# Patient Record
Sex: Female | Born: 1965 | Race: White | Hispanic: No | Marital: Married | Smoking: Never smoker
Health system: Southern US, Community
[De-identification: ages and names within clinical notes are randomized; demographics above are authoritative.]

## PROBLEM LIST (undated history)

## (undated) DIAGNOSIS — R7303 Prediabetes: Secondary | ICD-10-CM

## (undated) DIAGNOSIS — F32A Depression, unspecified: Secondary | ICD-10-CM

## (undated) DIAGNOSIS — E039 Hypothyroidism, unspecified: Secondary | ICD-10-CM

## (undated) DIAGNOSIS — R011 Cardiac murmur, unspecified: Secondary | ICD-10-CM

## (undated) DIAGNOSIS — E785 Hyperlipidemia, unspecified: Secondary | ICD-10-CM

## (undated) DIAGNOSIS — K219 Gastro-esophageal reflux disease without esophagitis: Secondary | ICD-10-CM

## (undated) DIAGNOSIS — R7309 Other abnormal glucose: Secondary | ICD-10-CM

## (undated) DIAGNOSIS — N979 Female infertility, unspecified: Secondary | ICD-10-CM

## (undated) DIAGNOSIS — N83201 Unspecified ovarian cyst, right side: Secondary | ICD-10-CM

## (undated) DIAGNOSIS — J189 Pneumonia, unspecified organism: Secondary | ICD-10-CM

## (undated) DIAGNOSIS — R5382 Chronic fatigue, unspecified: Secondary | ICD-10-CM

## (undated) DIAGNOSIS — Z9889 Other specified postprocedural states: Secondary | ICD-10-CM

## (undated) DIAGNOSIS — F329 Major depressive disorder, single episode, unspecified: Secondary | ICD-10-CM

## (undated) DIAGNOSIS — R112 Nausea with vomiting, unspecified: Secondary | ICD-10-CM

## (undated) DIAGNOSIS — D509 Iron deficiency anemia, unspecified: Secondary | ICD-10-CM

## (undated) DIAGNOSIS — D649 Anemia, unspecified: Secondary | ICD-10-CM

## (undated) DIAGNOSIS — F419 Anxiety disorder, unspecified: Secondary | ICD-10-CM

## (undated) HISTORY — DX: Hypothyroidism, unspecified: E03.9

## (undated) HISTORY — DX: Cardiac murmur, unspecified: R01.1

## (undated) HISTORY — DX: Hyperlipidemia, unspecified: E78.5

## (undated) HISTORY — DX: Iron deficiency anemia, unspecified: D50.9

## (undated) HISTORY — DX: Unspecified ovarian cyst, right side: N83.201

## (undated) HISTORY — DX: Other abnormal glucose: R73.09

## (undated) HISTORY — PX: WISDOM TOOTH EXTRACTION: SHX21

## (undated) HISTORY — DX: Female infertility, unspecified: N97.9

## (undated) HISTORY — DX: Anxiety disorder, unspecified: F41.9

## (undated) HISTORY — DX: Depression, unspecified: F32.A

## (undated) HISTORY — DX: Anemia, unspecified: D64.9

## (undated) HISTORY — DX: Major depressive disorder, single episode, unspecified: F32.9

## (undated) HISTORY — PX: PELVIC LAPAROSCOPY: SHX162

## (undated) HISTORY — DX: Chronic fatigue, unspecified: R53.82

## (undated) HISTORY — DX: Gastro-esophageal reflux disease without esophagitis: K21.9

## (undated) HISTORY — PX: MOUTH SURGERY: SHX715

---

## 2004-10-06 ENCOUNTER — Encounter (INDEPENDENT_AMBULATORY_CARE_PROVIDER_SITE_OTHER): Payer: Self-pay | Admitting: *Deleted

## 2004-10-06 LAB — CONVERTED CEMR LAB

## 2006-10-13 ENCOUNTER — Ambulatory Visit: Payer: Self-pay | Admitting: Internal Medicine

## 2006-10-18 ENCOUNTER — Ambulatory Visit: Payer: Self-pay | Admitting: Internal Medicine

## 2006-10-18 LAB — CONVERTED CEMR LAB
ALT: 17 units/L (ref 0–40)
AST: 20 units/L (ref 0–37)
Albumin: 3.7 g/dL (ref 3.5–5.2)
Alkaline Phosphatase: 49 units/L (ref 39–117)
Basophils Relative: 0.1 % (ref 0.0–1.0)
Chloride: 109 meq/L (ref 96–112)
Creatinine, Ser: 1 mg/dL (ref 0.4–1.2)
Eosinophil percent: 1.6 % (ref 0.0–5.0)
GFR calc non Af Amer: 65 mL/min
Glucose, Bld: 93 mg/dL (ref 70–99)
HCT: 41.4 % (ref 36.0–46.0)
Hemoglobin: 13.7 g/dL (ref 12.0–15.0)
Hgb A1c MFr Bld: 5.5 % (ref 4.6–6.0)
MCHC: 33.1 g/dL (ref 30.0–36.0)
Neutro Abs: 4 10*3/uL (ref 1.4–7.7)
Platelets: 276 10*3/uL (ref 150–400)
RBC: 4.78 M/uL (ref 3.87–5.11)

## 2007-01-04 ENCOUNTER — Other Ambulatory Visit: Admission: RE | Admit: 2007-01-04 | Discharge: 2007-01-04 | Payer: Self-pay | Admitting: Gynecology

## 2007-02-04 ENCOUNTER — Encounter: Admission: RE | Admit: 2007-02-04 | Discharge: 2007-02-04 | Payer: Self-pay | Admitting: Gynecology

## 2007-10-18 ENCOUNTER — Telehealth (INDEPENDENT_AMBULATORY_CARE_PROVIDER_SITE_OTHER): Payer: Self-pay | Admitting: *Deleted

## 2007-11-18 ENCOUNTER — Telehealth (INDEPENDENT_AMBULATORY_CARE_PROVIDER_SITE_OTHER): Payer: Self-pay | Admitting: *Deleted

## 2007-11-28 ENCOUNTER — Ambulatory Visit: Payer: Self-pay | Admitting: Internal Medicine

## 2007-11-28 DIAGNOSIS — K209 Esophagitis, unspecified without bleeding: Secondary | ICD-10-CM | POA: Insufficient documentation

## 2007-11-28 DIAGNOSIS — E039 Hypothyroidism, unspecified: Secondary | ICD-10-CM | POA: Insufficient documentation

## 2007-11-28 DIAGNOSIS — K219 Gastro-esophageal reflux disease without esophagitis: Secondary | ICD-10-CM

## 2007-11-28 DIAGNOSIS — E782 Mixed hyperlipidemia: Secondary | ICD-10-CM | POA: Insufficient documentation

## 2007-11-28 DIAGNOSIS — R7989 Other specified abnormal findings of blood chemistry: Secondary | ICD-10-CM | POA: Insufficient documentation

## 2007-12-01 ENCOUNTER — Encounter (INDEPENDENT_AMBULATORY_CARE_PROVIDER_SITE_OTHER): Payer: Self-pay | Admitting: *Deleted

## 2007-12-01 DIAGNOSIS — M5412 Radiculopathy, cervical region: Secondary | ICD-10-CM | POA: Insufficient documentation

## 2007-12-27 ENCOUNTER — Telehealth (INDEPENDENT_AMBULATORY_CARE_PROVIDER_SITE_OTHER): Payer: Self-pay | Admitting: *Deleted

## 2008-01-16 ENCOUNTER — Other Ambulatory Visit: Admission: RE | Admit: 2008-01-16 | Discharge: 2008-01-16 | Payer: Self-pay | Admitting: Gynecology

## 2008-02-14 ENCOUNTER — Ambulatory Visit: Payer: Self-pay | Admitting: Licensed Clinical Social Worker

## 2008-02-28 ENCOUNTER — Ambulatory Visit: Payer: Self-pay | Admitting: Licensed Clinical Social Worker

## 2008-10-04 ENCOUNTER — Telehealth (INDEPENDENT_AMBULATORY_CARE_PROVIDER_SITE_OTHER): Payer: Self-pay | Admitting: *Deleted

## 2008-10-06 ENCOUNTER — Encounter: Payer: Self-pay | Admitting: Internal Medicine

## 2008-11-07 ENCOUNTER — Ambulatory Visit: Payer: Self-pay | Admitting: Internal Medicine

## 2008-11-07 DIAGNOSIS — J309 Allergic rhinitis, unspecified: Secondary | ICD-10-CM | POA: Insufficient documentation

## 2008-11-07 DIAGNOSIS — D239 Other benign neoplasm of skin, unspecified: Secondary | ICD-10-CM | POA: Insufficient documentation

## 2009-07-24 ENCOUNTER — Ambulatory Visit: Payer: Self-pay | Admitting: Family Medicine

## 2009-07-28 ENCOUNTER — Telehealth: Payer: Self-pay | Admitting: Family Medicine

## 2009-09-05 ENCOUNTER — Ambulatory Visit: Payer: Self-pay | Admitting: Internal Medicine

## 2009-09-05 LAB — CONVERTED CEMR LAB
Albumin: 3.8 g/dL (ref 3.5–5.2)
Basophils Relative: 0.6 % (ref 0.0–3.0)
Eosinophils Absolute: 0.1 10*3/uL (ref 0.0–0.7)
Glucose, Urine, Semiquant: NEGATIVE
Hemoglobin: 13.2 g/dL (ref 12.0–15.0)
Ketones, urine, test strip: NEGATIVE
Lipase: 14 units/L (ref 11.0–59.0)
MCHC: 33.1 g/dL (ref 30.0–36.0)
MCV: 91.5 fL (ref 78.0–100.0)
Monocytes Absolute: 0.4 10*3/uL (ref 0.1–1.0)
Neutro Abs: 5 10*3/uL (ref 1.4–7.7)
Neutrophils Relative %: 70.7 % (ref 43.0–77.0)
Protein, U semiquant: NEGATIVE
RBC: 4.37 M/uL (ref 3.87–5.11)
RDW: 12.9 % (ref 11.5–14.6)
Specific Gravity, Urine: 1.01
Total Protein: 7.3 g/dL (ref 6.0–8.3)
WBC Urine, dipstick: NEGATIVE

## 2009-09-06 ENCOUNTER — Encounter: Admission: RE | Admit: 2009-09-06 | Discharge: 2009-09-06 | Payer: Self-pay | Admitting: Internal Medicine

## 2009-09-06 ENCOUNTER — Telehealth: Payer: Self-pay | Admitting: Internal Medicine

## 2009-09-09 ENCOUNTER — Encounter: Payer: Self-pay | Admitting: Internal Medicine

## 2009-09-09 ENCOUNTER — Ambulatory Visit (HOSPITAL_COMMUNITY): Admission: RE | Admit: 2009-09-09 | Discharge: 2009-09-09 | Payer: Self-pay | Admitting: General Surgery

## 2009-10-03 ENCOUNTER — Encounter: Payer: Self-pay | Admitting: Internal Medicine

## 2009-10-05 HISTORY — PX: CHOLECYSTECTOMY: SHX55

## 2010-03-19 ENCOUNTER — Telehealth (INDEPENDENT_AMBULATORY_CARE_PROVIDER_SITE_OTHER): Payer: Self-pay | Admitting: *Deleted

## 2010-04-09 ENCOUNTER — Telehealth (INDEPENDENT_AMBULATORY_CARE_PROVIDER_SITE_OTHER): Payer: Self-pay | Admitting: *Deleted

## 2010-04-10 ENCOUNTER — Telehealth (INDEPENDENT_AMBULATORY_CARE_PROVIDER_SITE_OTHER): Payer: Self-pay | Admitting: *Deleted

## 2010-10-07 ENCOUNTER — Ambulatory Visit
Admission: RE | Admit: 2010-10-07 | Discharge: 2010-10-07 | Payer: Self-pay | Source: Home / Self Care | Attending: Internal Medicine | Admitting: Internal Medicine

## 2010-10-07 DIAGNOSIS — T753XXA Motion sickness, initial encounter: Secondary | ICD-10-CM | POA: Insufficient documentation

## 2010-10-07 LAB — CONVERTED CEMR LAB: Cholesterol, target level: 200 mg/dL

## 2010-10-26 ENCOUNTER — Encounter: Payer: Self-pay | Admitting: Internal Medicine

## 2010-11-04 NOTE — Letter (Signed)
Summary: [facility]   Imported By: Lanelle Bal 11/18/2009 13:12:42  _____________________________________________________________________  External Attachment:    Type:   Image     Comment:   External Document

## 2010-11-04 NOTE — Progress Notes (Signed)
Summary: refill  Phone Note Refill Request Message from:  Patient  Refills Requested: Medication #1:  ACIPHEX 20 MG  TBEC take one tablet daily refill delayed at [facility] - patient wants 7 day supply called in - she said [facility] approved 7 days supply  [address]  Initial call taken by: Okey Regal Spring,  April 10, 2010 11:13 AM    Prescriptions: ACIPHEX 20 MG  TBEC (RABEPRAZOLE SODIUM) take one tablet daily  #7 x 0   Entered by:   Shonna Chock   Authorized by:   Marga Melnick MD   Signed by:   Shonna Chock on 04/10/2010   Method used:   Electronically to        [id number] (retail[facility]       [address]       [place], [place]  [address]       Ph: [phone] or [phone]       Fax: [phone]   RxID:   [id number]

## 2010-11-04 NOTE — Progress Notes (Signed)
Summary: NEW PRESCRIPTION TO [facility]  Phone Note Call from Patient Call back at Home Phone [phone]   Caller: Spouse Summary of Call: PATIENT'S HUSBAND ERIC CALLED TO REPORT THAT SHE [facility]ED [facility], BUT NOW HAS SET UP AN ACCOUNT AT [facility]  PLEASE FAX PRESCRIPTION FOR 90 DAYS PLUS REFILLS FOR ACIFEX 20 MG TO [facility]---TAKE ONE PER DAY   [facility]'S  NUMBER IS [phone]  PLEASE CALL PATIENT'S HUSBAND TO ADVISE THAT PRESCRIPTION HAS BEEN SENT INTO [facility] Initial call taken by: Jerolyn Shin,  March 19, 2010 2:16 PM  Follow-up for Phone Call        Patient aware RX sent in Follow-up by: Shonna Chock,  March 19, 2010 4:07 PM    Prescriptions: ACIPHEX 20 MG  TBEC (RABEPRAZOLE SODIUM) take one tablet daily  #90 x 1   Entered by:   Shonna Chock   Authorized by:   Marga Melnick MD   Signed by:   Shonna Chock on 03/19/2010   Method used:   Faxed to ...       [facility] (mail-order)             , [place]         Ph: [phone]       Fax: [phone]   RxID:   [id number]

## 2010-11-04 NOTE — Progress Notes (Signed)
Summary: RX Concerns  Phone Note Outgoing Call   Call placed by: Shonna Chock,  April 09, 2010 8:07 AM Call placed to: Patient Summary of Call: Left message on machine for patient to return call when avaliable, Reason for call:   Aciphex was filled on 03/19/2010 #90 with 1 refill([facility]), now we received refill from [facility] [address], ? which pharmacy patient is using./[facility]  April 09, 2010 8:09 AM   Follow-up for Phone Call        Spoke with patient, patient said [facility] is no longer [facility]. I will call them and have then d/c sending rx to [place] Follow-up by: Shonna Chock,  April 09, 2010 11:50 AM

## 2010-11-06 NOTE — Assessment & Plan Note (Signed)
Summary: something for motion sickness//ph   Vital Signs:  Patient profile:   46 year old female Height:      62.25 inches Weight:      169.4 pounds BMI:     30.85 Pulse rate:   72 / minute Resp:     12 per minute BP sitting:   122 / 88  (left arm) Cuff size:   large  Vitals Entered By: Shonna Chock CMA (October 07, 2010 11:58 AM) CC: 1.) Rx for motion sickness, 5 day trip    2.) Discuss sleep concerns, problems staying/falling asleep, Heartburn, Lipid Management   CC:  1.) Rx for motion sickness, 5 day trip    2.) Discuss sleep concerns, problems staying/falling asleep, Heartburn, and Lipid Management.  History of Present Illness:      This is a 45 year old woman who presents with ERD.  The patient reports   weight gain of  4#, but denies acid reflux, sour taste in mouth, epigastric pain, chest pain, and trouble swallowing.  The patient denies the following alarm features: melena, dysphagia, hematemesis, and vomiting.  Symptoms are worse with alcohol(red wine) , eating late  and citrus.  Prior evaluation has included EGD, but the exam was incomplete due to nsuboptimal sedation.  The patient has found the following treatments to be effective: a PPI.                                                                                                                                                      She requests meds for motion sickness ; she will be going on cruise. She gets symptoms with curvy roads, air travel with turbulence  &  carnival rides. No PMH of PVD.  Lipid Management History:      Negative NCEP/ATP III risk factors include female age less than 107 years old, no history of early menopause without estrogen hormone replacement, non-diabetic, no family history for ischemic heart disease, non-tobacco-user status, non-hypertensive, no ASHD (atherosclerotic heart disease), no prior stroke/TIA, no peripheral vascular disease, and no history of aortic aneurysm.     Current Medications  (verified): 1)  Aciphex 20 Mg  Tbec (Rabeprazole Sodium) .... Take One Tablet Daily**appointment Due** 2)  Synthroid 25 Mcg Tabs (Levothyroxine Sodium) .Marland Kitchen.. 1 By Mouth Once Daily 3)  Centrum   Tabs (Multiple Vitamins-Minerals) .... Take 1 Tablet By Mouth Once A Day 4)  Fexofenadine Hcl 180 Mg Tabs (Fexofenadine Hcl) .Marland Kitchen.. 1 Once Daily As Needed Allergies 5)  Tylenol Pm Extra Strength 500-25 Mg Tabs (Diphenhydramine-Apap (Sleep)) .Marland Kitchen.. 1 By Mouth At Bedtime As Needed  Allergies: 1)  ! Bactrim 2)  ! Augmentin  Past History:  Past Medical History: Insulin resistance GERD Hypothyroidism Hyperlipidemia: NMR Lipoprofile 2008.LDL 123(2539/1568) HDL 55, TG 179.  [place] Study LDL goal = < 160.  Review of Systems Neuro:  Denies sensation of room spinning and weakness.  Physical Exam  General:  well-nourished;alert,appropriate and cooperative throughout examination Eyes:  No corneal or conjunctival inflammation noted. EOMI. Perrla.No nystagmus Mouth:  Oral mucosa and oropharynx without lesions or exudates.  Teeth in good repair. No pharyngeal erythema.   Lungs:  Normal respiratory effort, chest expands symmetrically. Lungs are clear to auscultation, no crackles or wheezes. Heart:  normal rate, regular rhythm, no gallop, no rub, no JVD, no HJR, and grade 1 /6 systolic murmur.   Abdomen:  Bowel sounds positive,abdomen soft and non-tender without masses, organomegaly or hernias noted. Neurologic:  alert & oriented X3, gait normal, DTRs symmetrical and normal, finger-to-nose normal, and Romberg negative.   Skin:  Intact without suspicious lesions or rashes No jaundice Psych:  memory intact for recent and remote, normally interactive, and good eye contact.     Impression & Recommendations:  Problem # 1:  GERD (ICD-530.1) controlled Her updated medication list for this problem includes:    Aciphex 20 Mg Tbec (Rabeprazole sodium) .Marland Kitchen... Take one tablet daily**appointment due**  Problem # 2:   MOTION SICKNESS (ICD-994.6)  Problem # 3:  HYPERLIPIDEMIA (ICD-272.2)  Problem # 4:  HYPOTHYROIDISM (ICD-244.9) as per Dr Chevis Pretty Her updated medication list for this problem includes:    Synthroid 25 Mcg Tabs (Levothyroxine sodium) .Marland Kitchen... 1 by mouth once daily  Complete Medication List: 1)  Aciphex 20 Mg Tbec (Rabeprazole sodium) .... Take one tablet daily**appointment due** 2)  Synthroid 25 Mcg Tabs (Levothyroxine sodium) .Marland Kitchen.. 1 by mouth once daily 3)  Centrum Tabs (Multiple vitamins-minerals) .... Take 1 tablet by mouth once a day 4)  Fexofenadine Hcl 180 Mg Tabs (Fexofenadine hcl) .Marland Kitchen.. 1 once daily as needed allergies 5)  Tylenol Pm Extra Strength 500-25 Mg Tabs (Diphenhydramine-apap (sleep)) .Marland Kitchen.. 1 by mouth at bedtime as needed 6)  Transderm-scop 1.5 Mg Pt72 (Scopolamine base) .Marland Kitchen.. 1 patch every 3 days as needed  Lipid Assessment/Plan:      Based on NCEP/ATP III, the patient's risk factor category is "0-1 risk factors".  The patient's lipid goals are as follows: Total cholesterol goal is 200; LDL cholesterol goal is 160; HDL cholesterol goal is 40; Triglyceride goal is 150.    Patient Instructions: 1)  Please schedule a follow-up  fasting Lab appointment in 4 months for [facility] Panel (1304X), Code : 272.4, V17.3. 2)  It is important that you exercise regularly at least 20 minutes 5 times a week. If you develop chest pain, have severe difficulty breathing, or feel very tired , stop exercising immediately and seek medical attention. Prescriptions: TRANSDERM-SCOP 1.5 MG PT72 (SCOPOLAMINE BASE) 1 patch every 3 days as needed  #6 x 0   Entered and Authorized by:   Marga Melnick MD   Signed by:   Marga Melnick MD on 10/07/2010   Method used:   [facility] ...       [facility] [address] [phone][facility]retail)       [address]       [place], [place]  [address]       Ph: [phone] or [phone]       Fax: [phone]   RxID:   [id number]    Orders  Added: 1)  Est. Patient Level III [36644]

## 2011-01-01 ENCOUNTER — Telehealth: Payer: Self-pay | Admitting: Internal Medicine

## 2011-01-01 ENCOUNTER — Other Ambulatory Visit: Payer: Self-pay | Admitting: Internal Medicine

## 2011-01-01 MED ORDER — RABEPRAZOLE SODIUM 20 MG PO TBEC: 20.0000 mg | DELAYED_RELEASE_TABLET | Freq: Every day | ORAL | Status: DC

## 2011-01-01 NOTE — Telephone Encounter (Signed)
Lauren Mccullough called to report that their insurance is changing as of 4/1 from [facility] to [employer]----pt will be out of Aciphex and synthroid before 4/1, but doesn't want to order from [facility]----do we have any samples for 2 weeks to carry her over----she can call [employer] on 4/1 and place new order, but it will take 7-10 days as a new subscriber to get the first order to her---please call spouse to pick up samples

## 2011-01-06 LAB — COMPREHENSIVE METABOLIC PANEL
BUN: 7 mg/dL (ref 6–23)
Calcium: 9.5 mg/dL (ref 8.4–10.5)
Creatinine, Ser: 0.79 mg/dL (ref 0.4–1.2)
Glucose, Bld: 114 mg/dL — ABNORMAL HIGH (ref 70–99)
Total Protein: 7.2 g/dL (ref 6.0–8.3)

## 2011-01-06 LAB — URINALYSIS, ROUTINE W REFLEX MICROSCOPIC
Glucose, UA: NEGATIVE mg/dL
Hgb urine dipstick: NEGATIVE
Specific Gravity, Urine: 1.027 (ref 1.005–1.030)
Urobilinogen, UA: 1 mg/dL (ref 0.0–1.0)

## 2011-01-06 LAB — CBC
MCHC: 34.3 g/dL (ref 30.0–36.0)
MCV: 88.3 fL (ref 78.0–100.0)
Platelets: 296 10*3/uL (ref 150–400)
RDW: 13.1 % (ref 11.5–15.5)

## 2011-01-06 LAB — DIFFERENTIAL
Lymphs Abs: 1.1 10*3/uL (ref 0.7–4.0)
Monocytes Relative: 6 % (ref 3–12)
Neutro Abs: 5.8 10*3/uL (ref 1.7–7.7)
Neutrophils Relative %: 78 % — ABNORMAL HIGH (ref 43–77)

## 2011-01-06 LAB — HCG, SERUM, QUALITATIVE: Preg, Serum: NEGATIVE

## 2011-02-03 ENCOUNTER — Other Ambulatory Visit: Payer: Self-pay

## 2011-02-09 ENCOUNTER — Telehealth: Payer: Self-pay | Admitting: Internal Medicine

## 2011-02-09 NOTE — Telephone Encounter (Signed)
Called to alert [place] that a prior authorization will be coming for her Acifex---only has one pill left---[facility]

## 2011-02-10 NOTE — Telephone Encounter (Signed)
Prior auth in process information completed and faxed.

## 2011-02-11 NOTE — Telephone Encounter (Signed)
Needs to be on preferred medication since there's no documentation of having tried any other meds preferred drugs are omeprazole, pantoprazole, lansopazole, generic zegerid, and nexium.  Hop pls advise.

## 2011-02-11 NOTE — Telephone Encounter (Signed)
Go with nexium

## 2011-02-11 NOTE — Telephone Encounter (Signed)
Left msg for pt to return call.

## 2011-02-13 NOTE — Telephone Encounter (Signed)
Spoke w/ pt says she has tried recommended options and after looking at previous prior auth medication should have been covered from 07/03/11 contacted pharmacy and discuss information informed that rx has been taking care of and pt is able to pick up medication and pt also aware of this.

## 2011-04-10 ENCOUNTER — Telehealth: Payer: Self-pay | Admitting: Internal Medicine

## 2011-04-10 MED ORDER — RABEPRAZOLE SODIUM 20 MG PO TBEC: 20.0000 mg | DELAYED_RELEASE_TABLET | Freq: Every day | ORAL | Status: DC

## 2011-04-10 NOTE — Telephone Encounter (Signed)
Or do we have samples??

## 2011-04-10 NOTE — Telephone Encounter (Signed)
Prescription is for Acifex

## 2011-04-10 NOTE — Telephone Encounter (Signed)
Pt is aware that med has been sent in, also informed him we have no samples.

## 2011-07-06 ENCOUNTER — Other Ambulatory Visit: Payer: Self-pay

## 2011-07-06 MED ORDER — RABEPRAZOLE SODIUM 20 MG PO TBEC: 20.0000 mg | DELAYED_RELEASE_TABLET | Freq: Every day | ORAL | Status: DC

## 2011-07-06 NOTE — Telephone Encounter (Signed)
RX sent

## 2011-08-05 ENCOUNTER — Ambulatory Visit (INDEPENDENT_AMBULATORY_CARE_PROVIDER_SITE_OTHER)
Admission: RE | Admit: 2011-08-05 | Discharge: 2011-08-05 | Disposition: A | Discharge status: Home / Self Care | Payer: BC Managed Care – PPO | Source: Ambulatory Visit | Attending: Internal Medicine | Admitting: Internal Medicine

## 2011-08-05 ENCOUNTER — Ambulatory Visit (INDEPENDENT_AMBULATORY_CARE_PROVIDER_SITE_OTHER): Payer: BC Managed Care – PPO | Admitting: Internal Medicine

## 2011-08-05 DIAGNOSIS — R509 Fever, unspecified: Secondary | ICD-10-CM

## 2011-08-05 DIAGNOSIS — IMO0001 Reserved for inherently not codable concepts without codable children: Secondary | ICD-10-CM

## 2011-08-05 DIAGNOSIS — R0989 Other specified symptoms and signs involving the circulatory and respiratory systems: Secondary | ICD-10-CM

## 2011-08-05 DIAGNOSIS — R05 Cough: Secondary | ICD-10-CM

## 2011-08-05 DIAGNOSIS — M791 Myalgia, unspecified site: Secondary | ICD-10-CM

## 2011-08-05 LAB — CBC WITH DIFFERENTIAL/PLATELET
Basophils Absolute: 0 10*3/uL (ref 0.0–0.1)
Eosinophils Relative: 0.1 % (ref 0.0–5.0)
HCT: 33.6 % — ABNORMAL LOW (ref 36.0–46.0)
Lymphocytes Relative: 8.4 % — ABNORMAL LOW (ref 12.0–46.0)
Lymphs Abs: 0.5 10*3/uL — ABNORMAL LOW (ref 0.7–4.0)
Monocytes Relative: 6.5 % (ref 3.0–12.0)
Platelets: 241 10*3/uL (ref 150.0–400.0)
WBC: 5.6 10*3/uL (ref 4.5–10.5)

## 2011-08-05 LAB — POCT INFLUENZA A/B
Influenza A, POC: NEGATIVE
Influenza B, POC: NEGATIVE

## 2011-08-05 MED ORDER — AZITHROMYCIN 250 MG PO TABS: ORAL_TABLET | ORAL | Status: DC

## 2011-08-05 MED ORDER — HYDROCODONE-HOMATROPINE 5-1.5 MG/5ML PO SYRP: 5.0000 mL | ORAL_SOLUTION | Freq: Four times a day (QID) | ORAL | Status: AC | PRN

## 2011-08-05 NOTE — Progress Notes (Signed)
Addended by: Candie Echevaria L on: 08/05/2011 01:16 PM   Modules accepted: Orders

## 2011-08-05 NOTE — Progress Notes (Signed)
Addended by: Beverely Low on: 08/05/2011 02:09 PM   Modules accepted: Orders

## 2011-08-05 NOTE — Patient Instructions (Signed)
Plain Mucinex for thick secretions ;force NON dairy fluids for next 48 hrs. Use a Neti pot daily as needed for sinus congestion 

## 2011-08-05 NOTE — Progress Notes (Signed)
  Subjective:    Patient ID: Lauren Mccullough, female    DOB: 10-03-1966, 45 y.o.   MRN: [id number]  HPI As of 10/29 she has had diffuse myalgias ,chills, fever up to 101 and nonproductive cough. The major and minor symptoms of rhinosinusitis were reviewed. She denies  Sore throat , nasal congestion/obstruction; nasal purulence; facial pain; anosmia; fatigue;  headache; halitosis; earache or  dental pain.  Rx: Mucinex, NSAIDS with minor benefit  Review of Systems     Objective:   Physical Exam   She appears healthy and well-nourished and in no acute distress  She has no scleral icterus or conjunctivitis.  ENT exam was unremarkable  She has no lymphadenopathy about the neck or axilla  She exhibits an S4 gallop without significant murmur  Rhonchi and rales are suggested at the left lower lobe.  She has no cyanosis, clubbing, or edema.        Assessment & Plan:  #1 myalgias, chills, and fever; flu screen is negative  #2 nonproductive cough; abnormal breath sounds left lower lobe. Rule out walking pneumonia.  Plan: See orders and recommendations

## 2011-08-06 ENCOUNTER — Telehealth: Payer: Self-pay

## 2011-08-06 DIAGNOSIS — R509 Fever, unspecified: Secondary | ICD-10-CM

## 2011-08-06 DIAGNOSIS — R0989 Other specified symptoms and signs involving the circulatory and respiratory systems: Secondary | ICD-10-CM

## 2011-08-06 DIAGNOSIS — R05 Cough: Secondary | ICD-10-CM

## 2011-08-06 MED ORDER — AZITHROMYCIN 250 MG PO TABS: ORAL_TABLET | ORAL | Status: AC

## 2011-08-06 NOTE — Telephone Encounter (Signed)
Z-pak sent to wrong pharmacy.  Sent to correct pharmacy and pt's husband aware

## 2011-08-09 ENCOUNTER — Other Ambulatory Visit: Payer: Self-pay | Admitting: Internal Medicine

## 2011-08-09 DIAGNOSIS — J189 Pneumonia, unspecified organism: Secondary | ICD-10-CM

## 2011-08-11 ENCOUNTER — Ambulatory Visit (INDEPENDENT_AMBULATORY_CARE_PROVIDER_SITE_OTHER)
Admission: RE | Admit: 2011-08-11 | Discharge: 2011-08-11 | Disposition: A | Discharge status: Home / Self Care | Payer: BC Managed Care – PPO | Source: Ambulatory Visit | Attending: Internal Medicine | Admitting: Internal Medicine

## 2011-08-11 DIAGNOSIS — J189 Pneumonia, unspecified organism: Secondary | ICD-10-CM

## 2011-08-12 ENCOUNTER — Telehealth: Payer: Self-pay

## 2011-08-12 MED ORDER — FLUCONAZOLE 150 MG PO TABS: 150.0000 mg | ORAL_TABLET | Freq: Once | ORAL | Status: DC

## 2011-08-12 NOTE — Telephone Encounter (Signed)
Spoke with patient, patient aware of Chest Xray results. Patient already with a Zpak (one was sent to mail order company in error and was not cancelled, patient will take), patient stated taking abx's give her a yeast infection and requested rx for Diflucan. Copy of report mailed to patient   Per Dr.Hopper ok to send in rx for Diflucan

## 2011-08-12 NOTE — Telephone Encounter (Signed)
Message copied by Edgardo Roys on Wed Aug 12, 2011  8:47 AM ------      Message from: Pecola Lawless      Created: Tue Aug 11, 2011  5:54 PM       Almost complete resolution of the pneumonia picture. Please refill Z-Pak and take one pill a day until completed. Do not take 2 the first day. Continue to blow up the balloons. [employer]

## 2011-08-28 ENCOUNTER — Telehealth: Payer: Self-pay | Admitting: *Deleted

## 2011-08-28 NOTE — Telephone Encounter (Signed)
Recommend chest x-ray if she's having shortness of breath , fever or purulent secretions. If not allergic Hydromet 120 cc, 1 tsp every 6 hrs as needed, can be called in

## 2011-08-28 NOTE — Telephone Encounter (Signed)
Spoke with patient, patient denies any symptoms other than cough. Patient still with cough med from 2 weeks ago, will continue to take and call if any symptoms develop

## 2011-08-28 NOTE — Telephone Encounter (Signed)
Last OV 08-05-11 was treated for PNA and finished Z-pac 2 weeks ago, advised that she has a cough now, should this concern her enough to come in? Should she travel to [place] to visit with family that has young baby?

## 2011-12-25 ENCOUNTER — Other Ambulatory Visit: Payer: Self-pay | Admitting: Internal Medicine

## 2011-12-25 MED ORDER — RABEPRAZOLE SODIUM 20 MG PO TBEC: 20.0000 mg | DELAYED_RELEASE_TABLET | Freq: Every day | ORAL | Status: DC

## 2011-12-25 NOTE — Telephone Encounter (Signed)
RX sent

## 2011-12-25 NOTE — Telephone Encounter (Signed)
Refill for  aciphex tab 20mg  tablet ENT  Qty 90 Last written 10.1.12

## 2012-06-07 ENCOUNTER — Other Ambulatory Visit: Payer: Self-pay | Admitting: Internal Medicine

## 2012-06-07 MED ORDER — RABEPRAZOLE SODIUM 20 MG PO TBEC: 20.0000 mg | DELAYED_RELEASE_TABLET | Freq: Every day | ORAL | Status: DC

## 2012-06-07 NOTE — Telephone Encounter (Signed)
Refill ACIPHEX 20 MG Take 1 tablet (20 mg total) by mouth daily, #90  Last wrt 3.22.13 #90 wt/1-refill  last ov 10.31.12

## 2012-06-07 NOTE — Telephone Encounter (Signed)
RX sent in, patient will need to schedule CPX prior to next refill request (noted on RX)

## 2012-09-05 ENCOUNTER — Other Ambulatory Visit: Payer: Self-pay | Admitting: Internal Medicine

## 2012-09-05 MED ORDER — RABEPRAZOLE SODIUM 20 MG PO TBEC: DELAYED_RELEASE_TABLET | ORAL | Status: DC

## 2012-09-05 NOTE — Telephone Encounter (Signed)
Side Note: Patient will need to scheduled a CPX

## 2012-09-05 NOTE — Telephone Encounter (Signed)
refill ACIPHEX 20 MG Take 1 tablet (20 mg total) by mouth daily. #90 last OV 10.31.12, no future appts scheduled

## 2012-09-06 ENCOUNTER — Other Ambulatory Visit: Payer: Self-pay

## 2012-09-06 NOTE — Telephone Encounter (Signed)
Pt LMOVM stating plz fax back [facility] for Aciphex. Pt chart shows [facility] sent in Rx yesterday. I called pt and advised. Pt stated understanding and said she will call [facility] back to verify they received our fax.     MW

## 2012-11-25 ENCOUNTER — Ambulatory Visit (INDEPENDENT_AMBULATORY_CARE_PROVIDER_SITE_OTHER): Payer: BC Managed Care – PPO | Admitting: Internal Medicine

## 2012-11-25 ENCOUNTER — Encounter: Payer: Self-pay | Admitting: Internal Medicine

## 2012-11-25 VITALS — BP 122/78 | HR 78 | Temp 98.4°F | Wt 162.8 lb

## 2012-11-25 DIAGNOSIS — M6580 Other synovitis and tenosynovitis, unspecified site: Secondary | ICD-10-CM

## 2012-11-25 MED ORDER — RABEPRAZOLE SODIUM 20 MG PO TBEC: DELAYED_RELEASE_TABLET | ORAL | Status: DC

## 2012-11-25 NOTE — Progress Notes (Signed)
  Subjective:    Patient ID: Lauren Mccullough, female    DOB: 09-Jun-1966, 47 y.o.   MRN: [id number]  HPI The pain began 6 weeks ago in R elbow   without associated injury or trigger except yoga day prior to onset. It is described as  aching up to level 7. The pain does radiates down  to middle finger .  The discomfort lasts as long as flexing elbow or lifting even a coffee cup.  No associated signs/symptoms of redness, swelling, stiffness,skin color change,or temperature change. The pain was treated with NSAIDS  ; response was partial.                                                                                      Review of Systems  Constitutional: no fever, chills, sweats, change in weight  Musculoskeletal:no  muscle cramps or pain Neuro: no weakness; incontinence (stool/urine); numbness and tingling Heme:no lymphadenopathy; abnormal bruising or bleeding             Objective:   Physical Exam Gen.: Healthy and well-nourished in appearance. Alert, appropriate and cooperative throughout exam.  Neck: No deformities, masses, or tenderness noted. Range of motion &  Thyroid normal.                            Musculoskeletal/extremities: There is some asymmetry of the posterior thoracic musculature suggesting occult scoliosis.. No clubbing, cyanosis, edema, or significant extremity  deformity noted. Range of motion UE normal bilaterally .Tone & strength  normal.Joints normal. Nail health good. There is no pain with passive range of motion of the right elbow. With grip there is pain at the right lateral elbow. This is not appear to be worse with pronation. Vascular: Radial artery pulses are full and equal.  Neurologic: Alert and oriented x3. Deep tendon reflexes symmetrical and normal.  Skin: Intact without suspicious lesions or rashes. Lymph: No cervical, axillary lymphadenopathy present. Psych: Mood and affect are normal. Normally interactive         Assessment & Plan:  #1  tenosynovitis right olecranon area  Plan: See orders/ recommendations

## 2012-11-25 NOTE — Patient Instructions (Addendum)
Perform the exercises for elbow pain  twice a day as discussed. Use an anti-inflammatory cream such as Aspercreme or Zostrix cream twice a day to the R elbow AFTER EXERCISES. In lieu of this warm moist compresses or  hot water bottle can be used. Do not apply ice . Consider glucosamine sulfate 1500 mg daily for joint symptoms. Take this daily  for 3 -4 weeks. This will rehydrate the cartilages.

## 2013-02-21 ENCOUNTER — Ambulatory Visit (INDEPENDENT_AMBULATORY_CARE_PROVIDER_SITE_OTHER): Payer: BC Managed Care – PPO | Admitting: Family Medicine

## 2013-02-21 ENCOUNTER — Encounter: Payer: Self-pay | Admitting: Family Medicine

## 2013-02-21 VITALS — BP 130/90 | HR 87 | Temp 98.7°F | Wt 165.4 lb

## 2013-02-21 DIAGNOSIS — L255 Unspecified contact dermatitis due to plants, except food: Secondary | ICD-10-CM

## 2013-02-21 DIAGNOSIS — L247 Irritant contact dermatitis due to plants, except food: Secondary | ICD-10-CM

## 2013-02-21 MED ORDER — PREDNISONE 10 MG PO TABS: ORAL_TABLET | ORAL | Status: DC

## 2013-02-21 NOTE — Progress Notes (Signed)
  Subjective:     Lauren Mccullough is a 47 y.o. female who presents for evaluation of a rash involving the forearm, lower extremity and neck. Rash started several days ago. Lesions are pink, and blistering in texture. Rash has changed over time. Rash is pruritic. Associated symptoms: none. Patient denies: abdominal pain, arthralgia, congestion, cough, decrease in appetite, decrease in energy level, fever, headache, irritability, myalgia, nausea, sore throat and vomiting. Patient has not had contacts with similar rash. Patient has had new exposures (soaps, lotions, laundry detergents, foods, medications, plants, insects or animals).  The following portions of the patient's history were reviewed and updated as appropriate: allergies, current medications, past family history, past medical history, past social history, past surgical history and problem list.  Review of Systems Pertinent items are noted in HPI.    Objective:    BP 130/90  Pulse 87  Temp(Src) 98.7 F (37.1 C) (Oral)  Wt 165 lb 6.4 oz (75.025 kg)  BMI 29.31 kg/m2 General:  alert, cooperative, appears stated age and no distress  Skin:  erythema noted on extremities and trunk and vesicles noted on extremities and trunk     Assessment:    contact dermatitis: plants poison ivy ---probably from pets   Plan:    Medications: steroids: pred taper. verbal and written patient instruction given. Follow up in a few days. --if no better Use benadryl for itching

## 2013-02-21 NOTE — Patient Instructions (Addendum)
Poison Ivy  Poison ivy is a inflammation of the skin (contact dermatitis) caused by touching the allergens on the leaves of the ivy plant following previous exposure to the plant. The rash usually appears 48 hours after exposure. The rash is usually bumps (papules) or blisters (vesicles) in a linear pattern. Depending on your own sensitivity, the rash may simply cause redness and itching, or it may also progress to blisters which may break open. These must be well cared for to prevent secondary bacterial (germ) infection, followed by scarring. Keep any open areas dry, clean, dressed, and covered with an antibacterial ointment if needed. The eyes may also get puffy. The puffiness is worst in the morning and gets better as the day progresses. This dermatitis usually heals without scarring, within 2 to 3 weeks without treatment.  HOME CARE INSTRUCTIONS   Thoroughly wash with soap and water as soon as you have been exposed to poison ivy. You have about one half hour to remove the plant resin before it will cause the rash. This washing will destroy the oil or antigen on the skin that is causing, or will cause, the rash. Be sure to wash under your fingernails as any plant resin there will continue to spread the rash. Do not rub skin vigorously when washing affected area. Poison ivy cannot spread if no oil from the plant remains on your body. A rash that has progressed to weeping sores will not spread the rash unless you have not washed thoroughly. It is also important to wash any clothes you have been wearing as these may carry active allergens. The rash will return if you wear the unwashed clothing, even several days later.  Avoidance of the plant in the future is the best measure. Poison ivy plant can be recognized by the number of leaves. Generally, poison ivy has three leaves with flowering branches on a single stem.  Diphenhydramine may be purchased over the counter and used as needed for itching. Do not drive with  this medication if it makes you drowsy.Ask your caregiver about medication for children.  SEEK MEDICAL CARE IF:   Open sores develop.   Redness spreads beyond area of rash.   You notice purulent (pus-like) discharge.   You have increased pain.   Other signs of infection develop (such as fever).  Document Released: 09/18/2000 Document Revised: 12/14/2011 Document Reviewed: 08/07/2009  [employer] Patient Information 2013 [employer].

## 2013-03-30 IMAGING — CR DG CHEST 2V
2 series · 2 of 2 positions shown · non-contrast
Comparison: 08/05/2011

CLINICAL DATA: Cough

CHEST - 2 VIEW

[view not recorded (1 of 2)]
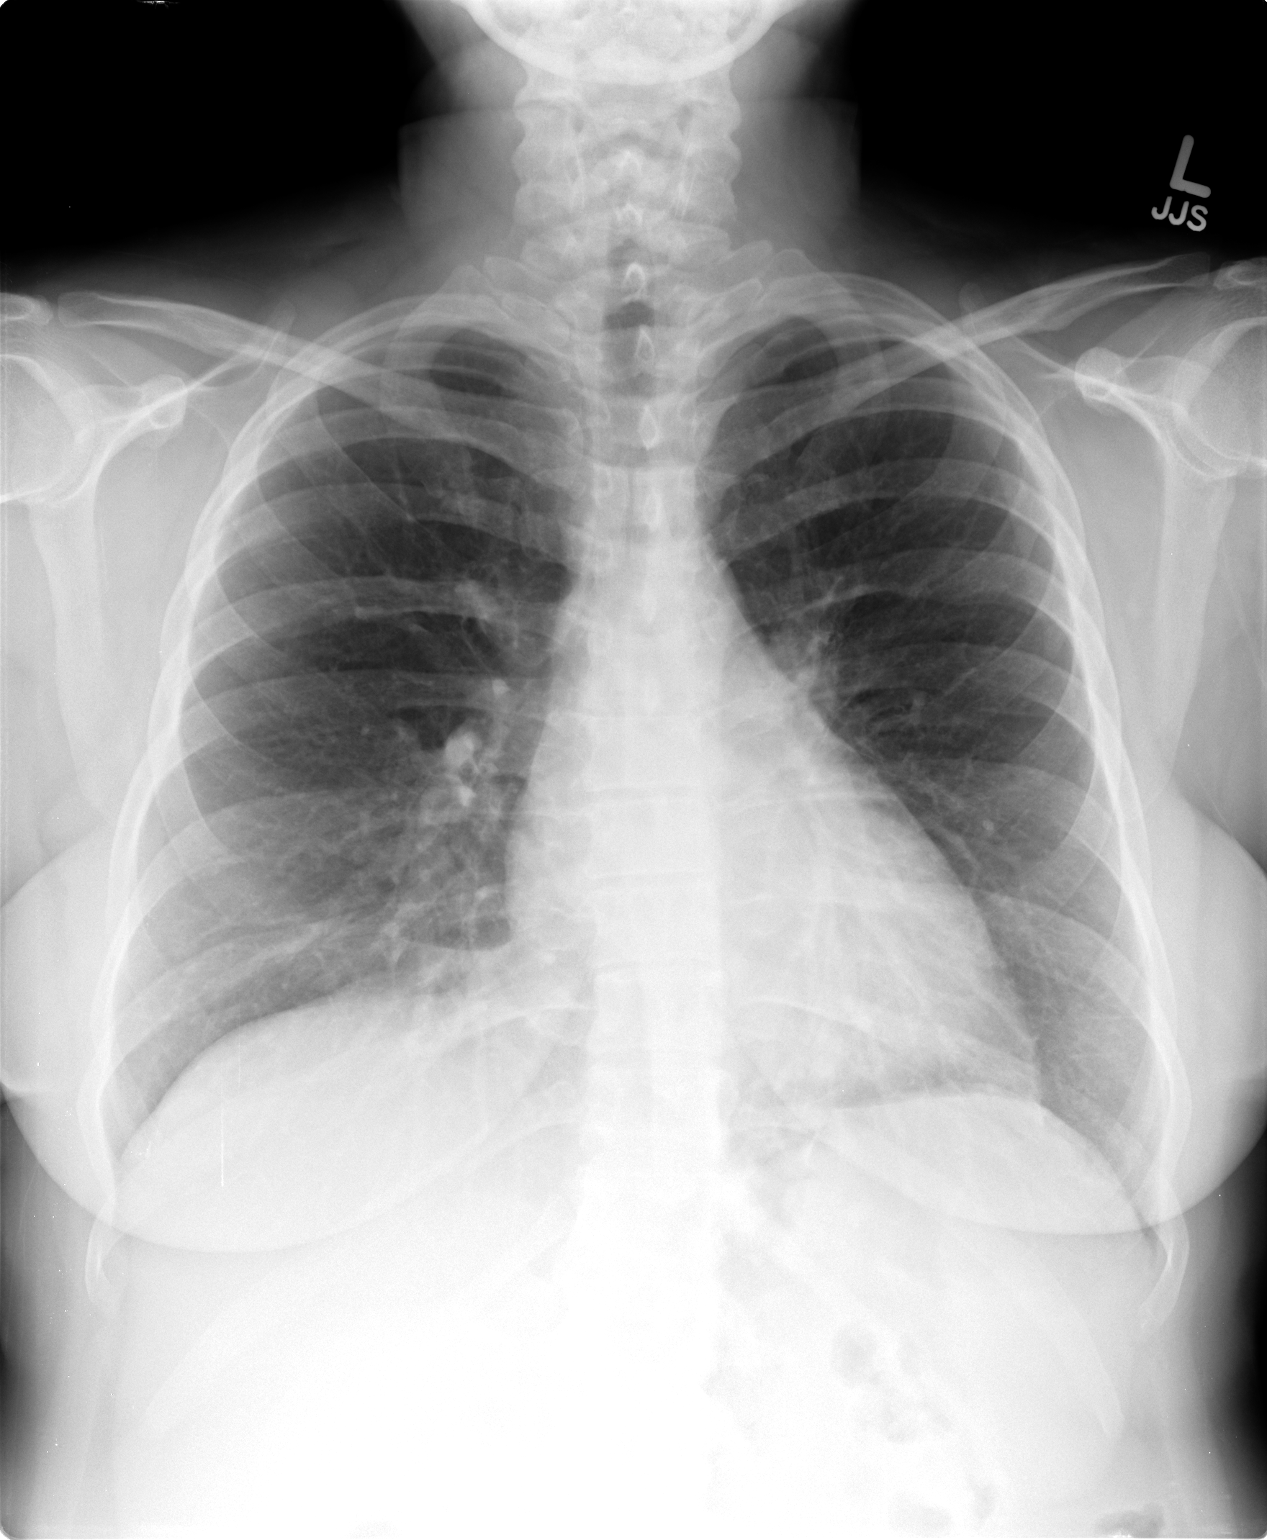

[view not recorded (2 of 2)]
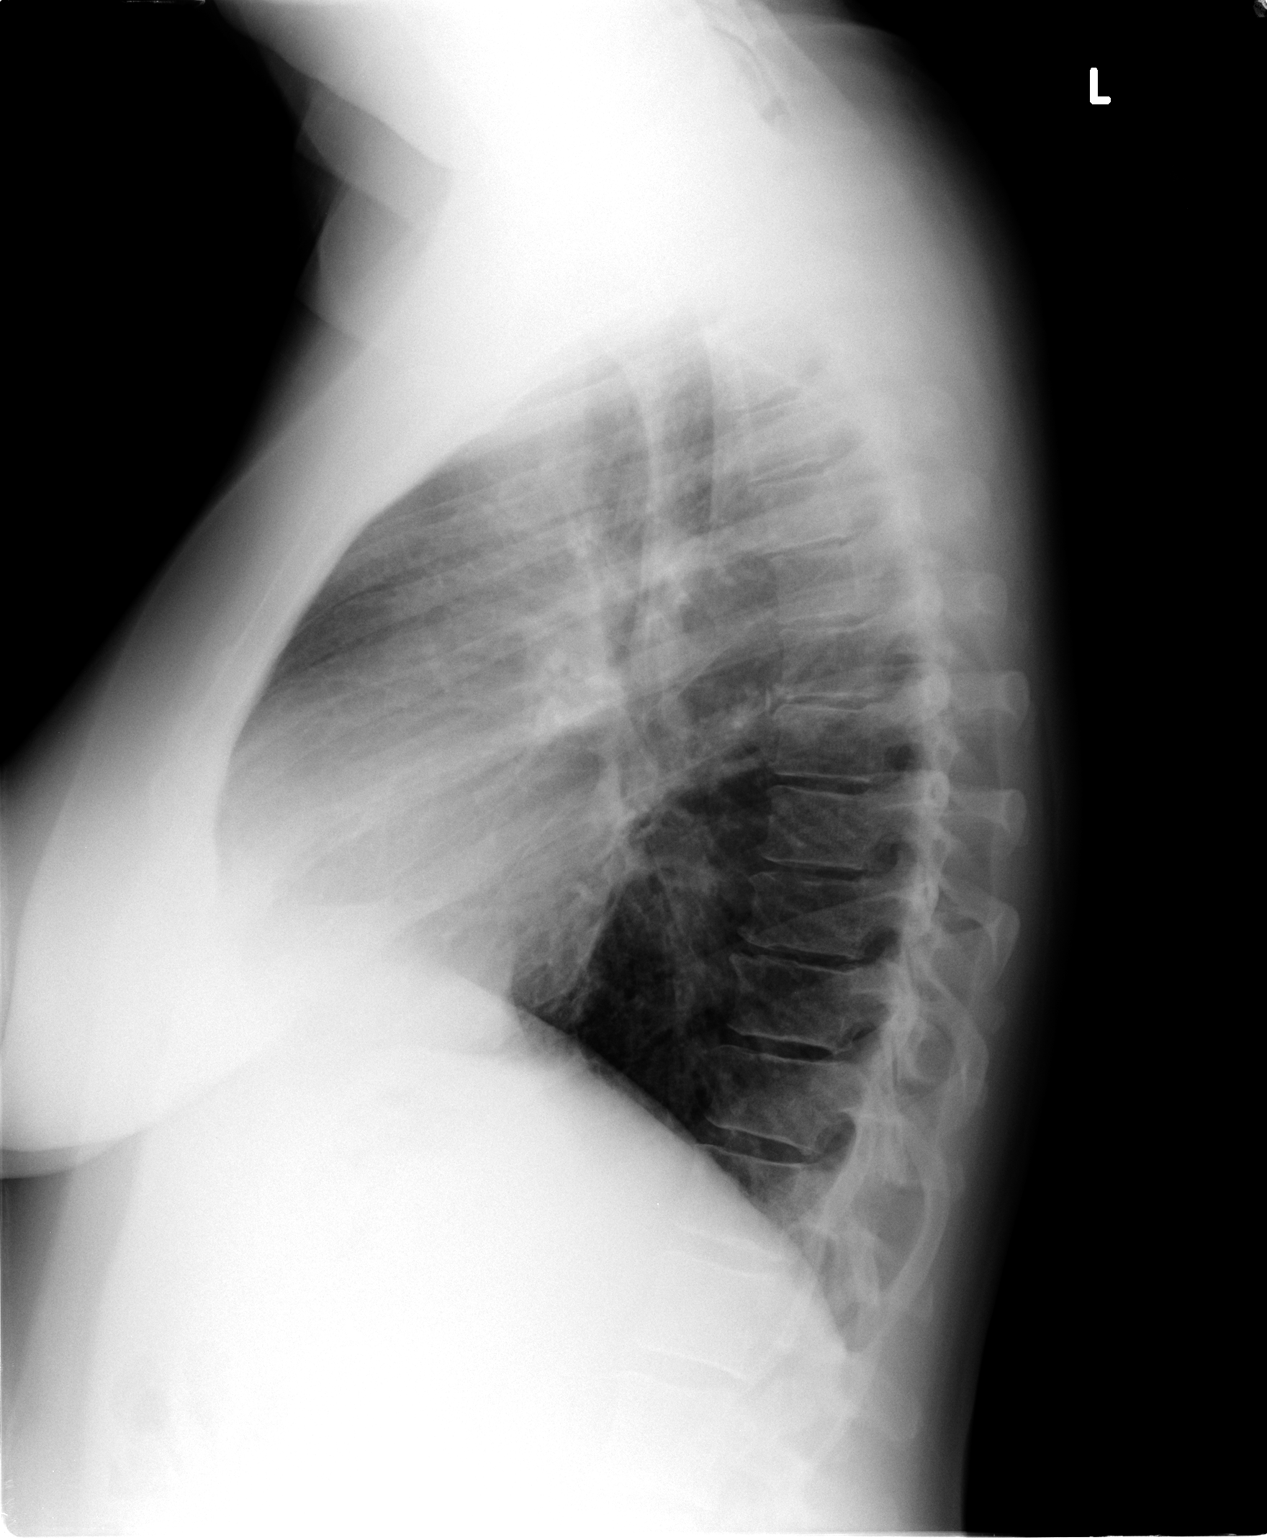

[2 of 2 positions shown; findings below may reference images not displayed]

FINDINGS: Vascular clips in the right upper abdomen.  Heart size
upper limits normal.  Interval improvement in the left lower lobe
airspace disease seen previously, with a minimal retrocardiac
residual.  Right lung clear.  No effusion.  Regional bones
unremarkable.
IMPRESSION: Near complete interval clearance of left lower lobe airspace
disease.

## 2013-05-18 ENCOUNTER — Other Ambulatory Visit: Payer: Self-pay | Admitting: *Deleted

## 2013-05-18 DIAGNOSIS — K219 Gastro-esophageal reflux disease without esophagitis: Secondary | ICD-10-CM

## 2013-05-18 MED ORDER — RABEPRAZOLE SODIUM 20 MG PO TBEC: DELAYED_RELEASE_TABLET | ORAL | Status: DC

## 2013-05-18 NOTE — Telephone Encounter (Signed)
Rx was filled for rabeprazole 20 mg.  Ag cma

## 2013-05-22 ENCOUNTER — Other Ambulatory Visit: Payer: Self-pay | Admitting: General Practice

## 2013-05-22 DIAGNOSIS — K219 Gastro-esophageal reflux disease without esophagitis: Secondary | ICD-10-CM

## 2013-05-22 MED ORDER — RABEPRAZOLE SODIUM 20 MG PO TBEC: DELAYED_RELEASE_TABLET | ORAL | Status: DC

## 2013-08-24 ENCOUNTER — Telehealth: Payer: Self-pay | Admitting: Internal Medicine

## 2013-08-24 NOTE — Telephone Encounter (Signed)
Patient is calling to request a refill on her rx RABEprazole (ACIPHEX) 20 MG tablet. Please advise.

## 2013-08-28 ENCOUNTER — Other Ambulatory Visit: Payer: Self-pay | Admitting: *Deleted

## 2013-08-28 DIAGNOSIS — K219 Gastro-esophageal reflux disease without esophagitis: Secondary | ICD-10-CM

## 2013-08-28 MED ORDER — RABEPRAZOLE SODIUM 20 MG PO TBEC: DELAYED_RELEASE_TABLET | ORAL | Status: DC

## 2013-08-28 NOTE — Telephone Encounter (Signed)
Refill for aciphex sent to [facility], pt made aware.

## 2013-08-29 NOTE — Telephone Encounter (Signed)
Rx for Aciphex 20mg  was filled on (08-28-13).//AB/CMA

## 2014-08-23 ENCOUNTER — Other Ambulatory Visit: Payer: Self-pay | Admitting: Gynecology

## 2014-08-28 LAB — CYTOLOGY - PAP

## 2014-09-04 ENCOUNTER — Ambulatory Visit (INDEPENDENT_AMBULATORY_CARE_PROVIDER_SITE_OTHER): Payer: 59 | Admitting: Internal Medicine

## 2014-09-04 ENCOUNTER — Encounter: Payer: Self-pay | Admitting: Internal Medicine

## 2014-09-04 ENCOUNTER — Other Ambulatory Visit: Payer: Self-pay | Admitting: Gynecology

## 2014-09-04 VITALS — BP 110/68 | HR 70 | Temp 98.5°F | Wt 169.1 lb

## 2014-09-04 DIAGNOSIS — L500 Allergic urticaria: Secondary | ICD-10-CM

## 2014-09-04 DIAGNOSIS — R928 Other abnormal and inconclusive findings on diagnostic imaging of breast: Secondary | ICD-10-CM

## 2014-09-04 MED ORDER — HYDROXYZINE HCL 10 MG PO TABS: 10.0000 mg | ORAL_TABLET | Freq: Four times a day (QID) | ORAL | Status: DC | PRN

## 2014-09-04 MED ORDER — RANITIDINE HCL 150 MG PO TABS: 150.0000 mg | ORAL_TABLET | Freq: Two times a day (BID) | ORAL | Status: DC

## 2014-09-04 MED ORDER — PREDNISONE 20 MG PO TABS
20.0000 mg | ORAL_TABLET | Freq: Every day | ORAL | Status: DC
Start: 2014-09-04 — End: 2015-09-12

## 2014-09-04 NOTE — Progress Notes (Signed)
Pre visit review using our clinic review tool, if applicable. No additional management support is needed unless otherwise documented below in the visit note. 

## 2014-09-04 NOTE — Patient Instructions (Addendum)
Avoid perfumes and cosmetics which are not hypoallergenic. Restrict hyperallergenic foods at this time: Nuts, strawberries, seafood , chocolate, and tomatoes. Take the ranitidine 30 minutes before breakfast and evening meal in place of Aciphex This blocks a histamine pathway as we discussed Fill the  prescription for Prednisone it there is not dramatic improvement in the next 48-72 hours.

## 2014-09-04 NOTE — Progress Notes (Signed)
   Subjective:    Patient ID: Lauren Mccullough, female    DOB: 1965/10/16, 48 y.o.   MRN: [id number]  HPI    Her rash over the chest began 1.5 weeks ago; the onset was 48 hours after an argument with her adult daughter  She's taken Benadryl for the itching as well as Cort Aid topically. The cream helped itching but the rash has progressed.  She has no constitutional symptoms.     Review of Systems  No associated itchy, watery eyes.  Swelling of the lips or tongue denied.  Shortness of breath, wheezing, or cough absent.   Fever ,chills , or sweats denied. Purulence absent.  Diarrhea not present.     Objective:   Physical Exam General appearance:good health ;well nourished; no acute distress or increased work of breathing is present.  No  lymphadenopathy about the head, neck, or axilla noted.   Eyes: No conjunctival inflammation or lid edema is present. There is no scleral icterus.  Ears:  External ear exam shows no significant lesions or deformities.  Otoscopic examination reveals clear canals, tympanic membranes are intact bilaterally without bulging, retraction, inflammation or discharge.  Nose:  External nasal examination shows no deformity or inflammation. Nasal mucosa are pink and moist without lesions or exudates. No septal dislocation or deviation.No obstruction to airflow.   Oral exam: Dental hygiene is good; lips and gums are healthy appearing.There is no oropharyngeal erythema or exudate noted.   Neck:  No deformities, thyromegaly, masses, or tenderness noted.   Supple with full range of motion without pain.   Heart:  Normal rate and regular rhythm. S1 and S2 normal without gallop, murmur, click, rub or other extra sounds.   Lungs:Chest clear to auscultation; no wheezes, rhonchi,rales ,or rubs present.No increased work of breathing.    Extremities:  No cyanosis, edema, or clubbing  noted    Skin: Warm & dry w/o jaundice or tenting. Classic urticarial rash over  the chest bilaterally. Classic dermatographia with skin stimulation.         Assessment & Plan:  #1 urticaria See orders

## 2014-09-20 ENCOUNTER — Ambulatory Visit
Admission: RE | Admit: 2014-09-20 | Discharge: 2014-09-20 | Disposition: A | Discharge status: Home / Self Care | Payer: 59 | Source: Ambulatory Visit | Attending: Gynecology | Admitting: Gynecology

## 2014-09-20 ENCOUNTER — Ambulatory Visit (INDEPENDENT_AMBULATORY_CARE_PROVIDER_SITE_OTHER): Payer: 59 | Admitting: Licensed Clinical Social Worker

## 2014-09-20 DIAGNOSIS — F331 Major depressive disorder, recurrent, moderate: Secondary | ICD-10-CM

## 2014-09-20 DIAGNOSIS — F411 Generalized anxiety disorder: Secondary | ICD-10-CM | POA: Diagnosis not present

## 2014-09-20 DIAGNOSIS — R928 Other abnormal and inconclusive findings on diagnostic imaging of breast: Secondary | ICD-10-CM

## 2014-10-04 ENCOUNTER — Ambulatory Visit (INDEPENDENT_AMBULATORY_CARE_PROVIDER_SITE_OTHER): Payer: 59 | Admitting: Licensed Clinical Social Worker

## 2014-10-04 DIAGNOSIS — F411 Generalized anxiety disorder: Secondary | ICD-10-CM | POA: Diagnosis not present

## 2014-10-04 DIAGNOSIS — F331 Major depressive disorder, recurrent, moderate: Secondary | ICD-10-CM

## 2014-10-10 ENCOUNTER — Ambulatory Visit (INDEPENDENT_AMBULATORY_CARE_PROVIDER_SITE_OTHER): Payer: 59 | Admitting: Licensed Clinical Social Worker

## 2014-10-10 DIAGNOSIS — F331 Major depressive disorder, recurrent, moderate: Secondary | ICD-10-CM

## 2014-10-10 DIAGNOSIS — F411 Generalized anxiety disorder: Secondary | ICD-10-CM

## 2014-10-17 ENCOUNTER — Ambulatory Visit (INDEPENDENT_AMBULATORY_CARE_PROVIDER_SITE_OTHER): Payer: 59 | Admitting: Licensed Clinical Social Worker

## 2014-10-17 DIAGNOSIS — F411 Generalized anxiety disorder: Secondary | ICD-10-CM

## 2014-10-17 DIAGNOSIS — F331 Major depressive disorder, recurrent, moderate: Secondary | ICD-10-CM

## 2014-10-24 ENCOUNTER — Ambulatory Visit (INDEPENDENT_AMBULATORY_CARE_PROVIDER_SITE_OTHER): Payer: 59 | Admitting: Licensed Clinical Social Worker

## 2014-10-24 DIAGNOSIS — F331 Major depressive disorder, recurrent, moderate: Secondary | ICD-10-CM

## 2014-10-24 DIAGNOSIS — F411 Generalized anxiety disorder: Secondary | ICD-10-CM

## 2014-10-31 ENCOUNTER — Ambulatory Visit (INDEPENDENT_AMBULATORY_CARE_PROVIDER_SITE_OTHER): Payer: 59 | Admitting: Licensed Clinical Social Worker

## 2014-10-31 DIAGNOSIS — F411 Generalized anxiety disorder: Secondary | ICD-10-CM

## 2014-10-31 DIAGNOSIS — F331 Major depressive disorder, recurrent, moderate: Secondary | ICD-10-CM

## 2014-11-05 ENCOUNTER — Ambulatory Visit: Payer: 59 | Admitting: Licensed Clinical Social Worker

## 2015-01-09 ENCOUNTER — Encounter: Payer: Self-pay | Admitting: Internal Medicine

## 2015-01-09 NOTE — Progress Notes (Signed)
Prior authorization for Rabeprazole Sodium has been faxed back to [facility] @ [phone]

## 2015-01-10 NOTE — Progress Notes (Signed)
Raberprazole Sodium has been approved until 01/09/16

## 2015-01-11 ENCOUNTER — Other Ambulatory Visit: Payer: Self-pay

## 2015-01-11 MED ORDER — RABEPRAZOLE SODIUM 20 MG PO TBEC: DELAYED_RELEASE_TABLET | ORAL | Status: DC

## 2015-09-12 ENCOUNTER — Ambulatory Visit (INDEPENDENT_AMBULATORY_CARE_PROVIDER_SITE_OTHER): Payer: 59 | Admitting: Obstetrics and Gynecology

## 2015-09-12 ENCOUNTER — Encounter: Payer: Self-pay | Admitting: Obstetrics and Gynecology

## 2015-09-12 VITALS — BP 142/82 | HR 80 | Resp 16 | Ht 62.25 in | Wt 160.8 lb

## 2015-09-12 DIAGNOSIS — Z01419 Encounter for gynecological examination (general) (routine) without abnormal findings: Secondary | ICD-10-CM | POA: Diagnosis not present

## 2015-09-12 DIAGNOSIS — N83201 Unspecified ovarian cyst, right side: Secondary | ICD-10-CM | POA: Diagnosis not present

## 2015-09-12 NOTE — Patient Instructions (Signed)

## 2015-09-12 NOTE — Progress Notes (Signed)
Patient ID: Lauren Mccullough, female   DOB: 1965-10-08, 49 y.o.   MRN: [id number] 49 y.o. G19P1011 Married Caucasian female here for annual exam.   Patient states it is also time for follow up ultrasound to check right ovary which Dr. Carren Rang has been following every 6 months (dermoid vs. Endometrioma).  Ultrasound on 06/20/15 - right ovary contains a 2.6 x 3.2 x 2.9 cm complex cyst with hyperechoic areas consistent with possible dermoid.  There is also a second 1.5 x 2.1 x 2.3 cm complex cystic area consistent with possible endometrioma versus dermoid. Left ovary not seen. Increase in size of the right ovarian complex.  Has had hx of borderline elevated CA125 = 33 on 10/11/14. Has been told in the past her uterus is also enlarged.  No pelvic pain or discomfort.  If anything, feels pain on her left side.  Menses monthly.  First three days are painful and heavy bleeding and really heavy for only a few hours. No intermenstrual bleeding.   Mood swings recently.  Uses cannibis to sleep.  Hx infertility.   Does not like the idea of surgery, but wants to be safe in her care.   Hx hypothyroidism.  Feels great when her thyroid is in balance.  Just had testing and Dr. Carren Rang refilled Synthroid.  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   PCP:  Unice Cobble, MD  Ref:     Delila Pereyra, MD  Patient's last menstrual period was 08/19/2015 (exact date).          Sexually active: Yes.   female The current method of family planning is none.    Exercising: No.   Smoker:  No (patient does   Health Maintenance: Pap:  08-23-14 Neg:no HR HPV done.   History of abnormal Pap:  no MMG:  09-20-14 abnormal Lt.breast and had recall;Had Lt.Diag.benign calcifications left breast/BiRads2/return to yearly screening:[facility]. Colonoscopy:  n/a BMD:   n/a  Result  n/a TDaP:  Years ago(maybe when she was a child) Screening Labs:   Will do with new PCP.    reports that she has never smoked. She does not  have any smokeless tobacco history on file. She reports that she drinks about 1.8 oz of alcohol per week. She reports that she uses illicit drugs (Marijuana) about 7 times per week.  Past Medical History  Diagnosis Date  . GERD (gastroesophageal reflux disease)   . Hyperlipemia     NMR 2008, LDL 123(2539/1568) HDL 55, TG 179  . Heart murmur   . Hypothyroidism     hypothyroidism  . Anxiety   . Depression   . Infertility, female     Past Surgical History  Procedure Laterality Date  . Cholecystectomy  2011  . Cesarean section  1993    Current Outpatient Prescriptions  Medication Sig Dispense Refill  . KRILL OIL PO Take by mouth. 3 by mouth daily    . lactobacillus acidophilus (BACID) TABS tablet Take 2 tablets by mouth daily.    Marland Kitchen levothyroxine (SYNTHROID, LEVOTHROID) 75 MCG tablet Take 75 mcg by mouth daily before breakfast.    . RABEprazole (ACIPHEX) 20 MG tablet 1 by mouth daily 90 tablet 3   No current facility-administered medications for this visit.    Family History  Problem Relation Age of Onset  . Diabetes Father   . Hypertension Father   . Diabetes Mother   . Hypertension Mother   . Thyroid disease Mother  Graves Disease  . Heart attack Maternal Grandfather   . Heart attack Paternal Grandfather   . Coronary artery disease Paternal Grandfather   . Breast cancer Paternal Grandmother   . Diabetes Maternal Grandmother   . Hypertension Maternal Grandmother     ROS:  Pertinent items are noted in HPI.  Otherwise, a comprehensive ROS was negative.  Exam:   BP 142/82 mmHg  Pulse 80  Resp 16  Ht 5' 2.25" (1.581 m)  Wt 160 lb 12.8 oz (72.938 kg)  BMI 29.18 kg/m2  LMP 08/19/2015 (Exact Date)    General appearance: alert, cooperative and appears stated age Head: Normocephalic, without obvious abnormality, atraumatic Neck: no adenopathy, supple, symmetrical, trachea midline and thyroid normal to inspection and palpation Lungs: clear to auscultation  bilaterally Breasts: normal appearance, no masses or tenderness, Inspection negative, No nipple retraction or dimpling, No nipple discharge or bleeding, No axillary or supraclavicular adenopathy, 1 cm raised brown nevus in left axillary region.  Heart: regular rate and rhythm Abdomen: soft, non-tender; bowel sounds normal; no masses,  no organomegaly Extremities: extremities normal, atraumatic, no cyanosis or edema Skin: Skin color, texture, turgor normal. No rashes or lesions Lymph nodes: Cervical, supraclavicular, and axillary nodes normal. No abnormal inguinal nodes palpated Neurologic: Grossly normal  Pelvic: External genitalia:  0.5 cm left labial subdermal cyst, nontender.              Urethra:  normal appearing urethra with no masses, tenderness or lesions              Bartholins and Skenes: normal                 Vagina: normal appearing vagina with normal color and discharge, no lesions              Cervix: no lesions              Pap taken: Yes.   Bimanual Exam:  Uterus:  enlarged, 7  weeks size              Adnexa: normal adnexa and no mass, fullness, tenderness              Rectovaginal: No..  Declines.   Chaperone was present for exam.  Assessment:   Well woman visit with normal exam. Right adnexal masses - possible endometriomas versus endometrioma and dermoid. Borderline elevated CA125.  Enlarged uterus.  ? Adenomyosis.  Hx infertility.  Hypothyroidism.   Plan: Yearly mammogram recommended after age 31.  Recommended self breast exam.  Pap and HR HPV as above. Information about Calcium, Vitamin D, regular exercise program including cardiovascular and weight bearing exercise. Labs performed.  Yes.  .   See orders.  CA125.  Refills given on medications.  No..  See orders. Return for pelvic ultrasound.  Follow up annually and prn.     After visit summary provided.

## 2015-09-13 LAB — CA 125: CA 125: 28 U/mL (ref <35)

## 2015-09-17 LAB — IPS PAP TEST WITH HPV

## 2015-09-19 ENCOUNTER — Telehealth: Payer: Self-pay | Admitting: Obstetrics and Gynecology

## 2015-09-19 NOTE — Telephone Encounter (Signed)
Called patient to review benefits for procedure. Left voicemail to call back and review. °

## 2015-12-23 ENCOUNTER — Telehealth: Payer: Self-pay | Admitting: Internal Medicine

## 2015-12-23 NOTE — Telephone Encounter (Signed)
Patient would like to establish with Dr. Birdie Riddle patient aware of relocation. Please advise

## 2015-12-26 NOTE — Telephone Encounter (Signed)
LM for pt to call and schedule 30 min OV, transfer from Dr. Linna Darner

## 2015-12-26 NOTE — Telephone Encounter (Signed)
Ok to establish 

## 2016-01-13 ENCOUNTER — Telehealth: Payer: Self-pay | Admitting: Obstetrics and Gynecology

## 2016-01-13 NOTE — Telephone Encounter (Signed)
Spoke with patient regarding benefits for ultrasound. Patient understood and agreeable. Patient ready to schedule. Scheduled for 01/16/16 with Dr Quincy Simmonds at 1030am. Patient aware and agreeable to arrival date/time. Patient aware and agreeable to 72 hour cancellation policy with [id number] fee. As appointment was made within this window, patient aware she must call by end of business day on 01/13/16 to cancel without penalty. No further questions. Ok to close.

## 2016-01-16 ENCOUNTER — Ambulatory Visit (INDEPENDENT_AMBULATORY_CARE_PROVIDER_SITE_OTHER): Payer: 59

## 2016-01-16 ENCOUNTER — Encounter: Payer: Self-pay | Admitting: Obstetrics and Gynecology

## 2016-01-16 ENCOUNTER — Ambulatory Visit (INDEPENDENT_AMBULATORY_CARE_PROVIDER_SITE_OTHER): Payer: 59 | Admitting: Obstetrics and Gynecology

## 2016-01-16 VITALS — BP 144/76 | HR 80 | Ht 62.25 in | Wt 154.4 lb

## 2016-01-16 DIAGNOSIS — E038 Other specified hypothyroidism: Secondary | ICD-10-CM | POA: Diagnosis not present

## 2016-01-16 DIAGNOSIS — N83201 Unspecified ovarian cyst, right side: Secondary | ICD-10-CM | POA: Diagnosis not present

## 2016-01-16 DIAGNOSIS — N940 Mittelschmerz: Secondary | ICD-10-CM

## 2016-01-16 LAB — THYROID PANEL WITH TSH
FREE THYROXINE INDEX: 3 (ref 1.4–3.8)
T3 Uptake: 25 % (ref 22–35)
T4, Total: 11.8 ug/dL (ref 4.5–12.0)
TSH: 1.21 m[IU]/L

## 2016-01-16 NOTE — Progress Notes (Signed)
Subjective  50 y.o. G11P1011 Married Caucasian female here for pelvic ultrasound for recheck of right ovarian cyst.   Patient states it is also time for follow up ultrasound to check right ovary which Dr. Carren Rang has been following every 6 months (dermoid vs. Endometrioma).  Ultrasound on 06/20/15 - right ovary contains a 2.6 x 3.2 x 2.9 cm complex cyst with hyperechoic areas consistent with possible dermoid. There is also a second 1.5 x 2.1 x 2.3 cm complex cystic area consistent with possible endometrioma versus dermoid. Left ovary not seen. Increase in size of the right ovarian complex.  Has had hx of borderline elevated CA125 = 33 on 10/11/14. Has been told in the past her uterus is also enlarged.  No pelvic pain or discomfort.  If anything, feels pain on her left side.  Menses monthly. First three days are painful and heavy bleeding and really heavy for only a few hours. No intermenstrual bleeding.   Took combined oral contraceptives in the past without problem. Not a smoker.  Today patient states she has been "sick." Menses late in December. In March around ovulation time, had pain across her abdomen and felt like she had a gas bubble extending from her lower abdomen to her sternum.  Almost went to ER due to pain.  Had vomiting next day but nut sure why.  This last weekend had ovulation type pain again one month later, and had gas feeling again.   Asking to have her thyroid checked.   Patient's last menstrual period was 12/26/2015.  Objective  Pelvic ultrasound images and report reviewed with patient.  Uterus - retroflexed.  No masses. EMS - 11.18 mm. Ovaries - enlarged right ovary with multiple cystic areas and debris most consistent with endometriomas.  Left ovary normal.  Both ovaries adherent to uterine sidewalls. Free fluid - yes, small amount.     Assessemnt  Right ovarian cysts and ovarian enlargement.  Possible endometriomas.  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  Abdominal bloating.  Ovulation versus gastrointestinal process.  Hypothyroidism.  Plan  Discussion of ovarian cysts and possible endometriomas with adhesive disease.  Reviewed signs and symptoms of torsion.  Discussed options for care including surgical intervention versus trial of oral contraceptives. Patient aware that endometriomas will not resolve with contraceptive pill treatment.  Check CA125 and TFTs. Final plan for ovarian cysts to follow after CA125 has returned.  Colonoscopy recommended.  Patient will consider.   ___25____ minutes face to face time of which over 50% was spent in counseling.   After visit summary to patient.

## 2016-01-17 LAB — CA 125: CA 125: 87 U/mL — ABNORMAL HIGH (ref <35)

## 2016-01-20 ENCOUNTER — Telehealth: Payer: Self-pay | Admitting: Obstetrics and Gynecology

## 2016-01-20 DIAGNOSIS — R971 Elevated cancer antigen 125 [CA 125]: Secondary | ICD-10-CM

## 2016-01-20 DIAGNOSIS — N83201 Unspecified ovarian cyst, right side: Secondary | ICD-10-CM

## 2016-01-20 NOTE — Telephone Encounter (Signed)
Phone call to cell phone - [phone] per DPI.  Phone call to discuss CA125 = 87.  This is higher than it has been in the past here and with Dr. Carren Rang. TFTs were normal.   Ultrasound showed right endometriomas and enlarged ovary.   I am recommending consultation with Dr. Everitt Amber regarding right ovarian cysts and elevated CA125.  I have discussed potential need for surgical removal of the ovary.   Patient accepts the referral.

## 2016-01-20 NOTE — Telephone Encounter (Signed)
Patient is unavailable on Tuesdays for consultation with Dr. Denman George.

## 2016-01-20 NOTE — Telephone Encounter (Signed)
Scheduled patient first available appointment with Dr.Rossi on 02/07/2016 at 11:30 am with 11 am arrival time. Spoke with patient. Advised of appointment date and time. She is agreeable. Provided information to Dr.Rossi's office as seen below. Patient is agreeable and verbalizes understanding.  [facility] [address] [place], [place] [address]  Main: [phone]   Routing to provider for final review. Patient agreeable to disposition. Will close encounter.

## 2016-02-07 ENCOUNTER — Encounter: Payer: Self-pay | Admitting: Gynecologic Oncology

## 2016-02-07 ENCOUNTER — Ambulatory Visit: Payer: 59 | Attending: Gynecologic Oncology | Admitting: Gynecologic Oncology

## 2016-02-07 VITALS — HR 90 | Temp 98.5°F | Resp 20 | Ht 62.25 in | Wt 152.3 lb

## 2016-02-07 DIAGNOSIS — F329 Major depressive disorder, single episode, unspecified: Secondary | ICD-10-CM | POA: Diagnosis not present

## 2016-02-07 DIAGNOSIS — N979 Female infertility, unspecified: Secondary | ICD-10-CM | POA: Insufficient documentation

## 2016-02-07 DIAGNOSIS — R971 Elevated cancer antigen 125 [CA 125]: Secondary | ICD-10-CM | POA: Insufficient documentation

## 2016-02-07 DIAGNOSIS — N83201 Unspecified ovarian cyst, right side: Secondary | ICD-10-CM | POA: Diagnosis not present

## 2016-02-07 DIAGNOSIS — E039 Hypothyroidism, unspecified: Secondary | ICD-10-CM | POA: Diagnosis not present

## 2016-02-07 DIAGNOSIS — K219 Gastro-esophageal reflux disease without esophagitis: Secondary | ICD-10-CM | POA: Diagnosis not present

## 2016-02-07 DIAGNOSIS — R011 Cardiac murmur, unspecified: Secondary | ICD-10-CM | POA: Diagnosis not present

## 2016-02-07 DIAGNOSIS — F419 Anxiety disorder, unspecified: Secondary | ICD-10-CM | POA: Diagnosis not present

## 2016-02-07 DIAGNOSIS — E785 Hyperlipidemia, unspecified: Secondary | ICD-10-CM | POA: Insufficient documentation

## 2016-02-07 DIAGNOSIS — Z880 Allergy status to penicillin: Secondary | ICD-10-CM | POA: Insufficient documentation

## 2016-02-07 NOTE — Progress Notes (Signed)
Consult Note: Gyn-Onc  Consult was requested by Dr. Quincy Simmonds for the evaluation of Lauren Mccullough 50 y.o. female  CC:  Chief Complaint  Patient presents with  . New Consultation    ovarian cyst , elevated CA 125 level    Assessment/Plan:  Ms. Lauren Mccullough  is a 50 y.o.  year old with a right ovarian complex cystic mass and elevated CA 125 (88).  I reviewed the [place] images with the patient.  I discussed that overall the likelihood of malignancy is low given that this mass has been present for >5years with minimal change. However, the only way to confirm this would be to perform surgery (laparoscopic RSO, possible hysterectomy). The patient is very reluctant to undergo surgery. Therefore, I believe it is reasonable to repeat her surveillance to 3 months with another ultrasound and CA 125 at that time. Morelia acknowledges that if the mass shows consistent growth and the CA 125 is consistently trending upward at that time, she will concede to surgery with pathologic confirmation.  Misako will contact Dr Elza Rafter office to schedule the [place] and CA 125 for consistency across radiology/lab.  HPI: Lauren Mccullough is a 50 year old P1 who is seen in consultation at the request of Dr Quincy Simmonds for a right complex ovarian cyst and elevated CA 125.  The patient has had surveillance of a known right ovarian complex mass (dermoid vs endometrioma) since 2012. She is very concerned about surgery and hospitals and prefers to avoid surgery if possible.  As part of follow-up for her ovarian cyst, she underwent US and CA 125 in April, 2017. This showed an increase in size in the right ovarian mass now measuring 7.3 x 4 x 4.3 cm with several cystic areas with debris and thickened wall. Both    ovaries appear adherent to the uterine sidewall suggestive of adhesive disease.  CA-125 was drawn on 01/20/2016 was elevated at 87 and had previously been 28.  The patient reports that since March she's been feeling increased  cyclical pain, bloating, and discomfort. 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   Current Meds:  Outpatient Encounter Prescriptions as of 02/07/2016  Medication Sig  . ASTAXANTHIN PO Take 1 tablet by mouth every morning.  Marland Kitchen KRILL OIL PO Take by mouth. 3 by mouth daily  . lactobacillus acidophilus (BACID) TABS tablet Take 2 tablets by mouth daily.  Marland Kitchen levothyroxine (SYNTHROID, LEVOTHROID) 75 MCG tablet Take 75 mcg by mouth daily before breakfast.  . MISC NATURAL PRODUCTS PO Take 1 tablet by mouth 2 (two) times daily. pueraria mirifica plus  . Nutritional Supplements (JOINT FORMULA PO) Take 1 tablet by mouth every morning.  . RABEprazole (ACIPHEX) 20 MG tablet 1 by mouth daily   No facility-administered encounter medications on file as of 02/07/2016.    Allergy:  Allergies  Allergen Reactions  . Amoxicillin-Pot Clavulanate     REACTION: GI discomfort  . Sulfamethoxazole-Trimethoprim     REACTION: pruritis w/o fever, rash or hives  . Augmentin [Amoxicillin-Pot Clavulanate] Diarrhea    Social Hx:   Social History   Social History  . Marital Status: Married    Spouse Name: N/A  . Number of Children: N/A  . Years of Education: N/A   Occupational History  . Not on file.   Social History Main Topics  . Smoking status: Never Smoker   . Smokeless tobacco: Not on file  . Alcohol Use: 1.8 oz/week    3 Standard drinks or equivalent per  week  . Drug Use: 7.00 per week    Special: Marijuana     Comment: Ingests nightly to help with sleep  . Sexual Activity:    Partners: Male    Patent examiner Protection: None   Other Topics Concern  . Not on file   Social History Narrative    Past Surgical Hx:  Past Surgical History  Procedure Laterality Date  . Cholecystectomy  2011  . Cesarean section  1993    Past Medical Hx:  Past Medical History  Diagnosis Date  . GERD (gastroesophageal reflux disease)   . Hyperlipemia     NMR 2008, LDL 123(2539/1568) HDL 55, TG 179  . Heart  murmur   . Hypothyroidism     hypothyroidism  . Anxiety   . Depression   . Infertility, female     Past Gynecological History:  C/s x 1 Patient has history of right ovarian cyst.  Patient's last menstrual period was 12/26/2015.  Family Hx:  Family History  Problem Relation Age of Onset  . Diabetes Father   . Hypertension Father   . Diabetes Mother   . Hypertension Mother   . Thyroid disease Mother     Graves Disease  . Heart attack Maternal Grandfather   . Heart attack Paternal Grandfather   . Coronary artery disease Paternal Grandfather   . Breast cancer Paternal Grandmother   . Diabetes Maternal Grandmother   . Hypertension Maternal Grandmother     Review of Systems:  Constitutional  Feels well,    ENT Normal appearing ears and nares bilaterally Skin/Breast  No rash, sores, jaundice, itching, dryness Cardiovascular  No chest pain, shortness of breath, or edema  Pulmonary  No cough or wheeze.  Gastro Intestinal  No nausea, vomitting, or diarrhoea. No bright red blood per rectum, + abdominal pain, no change in bowel movement, or constipation.  Genito Urinary  No frequency, urgency, dysuria, cyclic bleeding Musculo Skeletal  No myalgia, arthralgia, joint swelling or pain  Neurologic  No weakness, numbness, change in gait,  Psychology  No depression, anxiety, insomnia.   Vitals:  Pulse 90, temperature 98.5 F (36.9 C), temperature source Oral, resp. rate 20, height 5' 2.25" (1.581 m), weight 152 lb 4.8 oz (69.083 kg), last menstrual period 12/26/2015, SpO2 100 %.  Physical Exam: WD in NAD Neck  Supple NROM, without any enlargements.  Lymph Node Survey No cervical supraclavicular or inguinal adenopathy Cardiovascular  Pulse normal rate, regularity and rhythm. S1 and S2 normal.  Lungs  Clear to auscultation bilateraly, without wheezes/crackles/rhonchi. Good air movement.  Skin  No rash/lesions/breakdown  Psychiatry  Alert and oriented to person, place,  and time  Abdomen  Normoactive bowel sounds, abdomen soft, non-tender and nonobese without evidence of hernia.  Back No CVA tenderness Genito Urinary  Vulva/vagina: Normal external female genitalia.  No lesions. No discharge or bleeding.  Bladder/urethra:  No lesions or masses, well supported bladder  Vagina: normal  Cervix: Normal appearing, no lesions.  Uterus:  Small, retroflexed, mobile, no parametrial involvement or nodularity.  Adnexa: difficult to discern adnexal masses. Rectal  Good tone, no masses no cul de sac nodularity.  Extremities  No bilateral cyanosis, clubbing or edema.   Donaciano Eva, MD  02/07/2016, 11:57 AM

## 2016-02-07 NOTE — Patient Instructions (Signed)
Plan to follow up with Dr. Quincy Simmonds with an ultrasound in July.  Please call our office at [phone] for any questions or concerns.

## 2016-02-09 ENCOUNTER — Other Ambulatory Visit: Payer: Self-pay | Admitting: Obstetrics and Gynecology

## 2016-02-09 DIAGNOSIS — R978 Other abnormal tumor markers: Secondary | ICD-10-CM

## 2016-02-09 DIAGNOSIS — N83201 Unspecified ovarian cyst, right side: Secondary | ICD-10-CM

## 2016-02-10 ENCOUNTER — Telehealth: Payer: Self-pay | Admitting: Obstetrics and Gynecology

## 2016-02-10 NOTE — Telephone Encounter (Signed)
Spoke with pt regarding benefit for Ultrasound. Patient understood and agreeable. Patient ready to schedule. Patient scheduled 04/16/16 with Dr Quincy Simmonds. Pt aware of arrival date and time. Pt aware of 72 hours cancellation policy with $___ fee. No further questions. Ok to close

## 2016-02-11 ENCOUNTER — Telehealth: Payer: Self-pay

## 2016-02-11 NOTE — Telephone Encounter (Signed)
-----   Message from Nunzio Cobbs, MD sent at 02/09/2016 11:21 AM EDT ----- Regarding: Please call to schedule pelvic ultrasound and CA125 with me for July 2017 Please contact patient in follow up to her visit with Dr. Denman George on 02/07/16.  She has received recommendations to follow up with me for a repeat pelvic ultrasound and CA125 in July 2017.  This will need a precert, and I am sending an order through for this along with a future order for a CA125.   Thank you!  [place]

## 2016-02-11 NOTE — Telephone Encounter (Signed)
Lauren Mccullough at 02/10/2016 11:08 AM     Status: Signed       Expand All Collapse All   Spoke with pt regarding benefit for Ultrasound. Patient understood and agreeable. Patient ready to schedule. Patient scheduled 04/16/16 with Dr Quincy Simmonds. Pt aware of arrival date and time. Pt aware of 72 hours cancellation policy with $___ fee. No further questions. Ok to close       Routing to provider for final review. Patient agreeable to disposition. Will close encounter.

## 2016-02-17 ENCOUNTER — Other Ambulatory Visit: Payer: Self-pay | Admitting: Internal Medicine

## 2016-02-17 ENCOUNTER — Telehealth: Payer: Self-pay | Admitting: Obstetrics and Gynecology

## 2016-02-17 NOTE — Telephone Encounter (Signed)
Patient states she is returning a call to [place].

## 2016-03-17 ENCOUNTER — Telehealth: Payer: Self-pay | Admitting: Obstetrics and Gynecology

## 2016-03-17 NOTE — Telephone Encounter (Signed)
Patient called and said, "I am really sick with some issues I originally came to see Dr. Quincy Simmonds for. I'd like to come in and see her as soon as possible. I really don't even know how to describe the problem I am having."  Patient aware a nurse will call her back to further assess her and get her schedule.

## 2016-03-17 NOTE — Telephone Encounter (Signed)
Spoke with patient. Patient states that right before her cycle she has been experiencing abdominal pain and vomiting. Vomiting occurred for 1/2 day prior to her menses. Reports this occurred in April as well and she discussed this with Dr.Silva at her appointment on 01/16/2016. Patient started her menses this morning and reports she feels much better. Denies any current nausea or vomiting. "I am very concerned about these symptoms." Advised she will need to be seen in the office for further evaluation. Appointment scheduled for tomorrow 03/18/2016 at 8:15 am with Dr.Silva. She is agreeable to date and time.  Routing to provider for final review. Patient agreeable to disposition. Will close encounter.

## 2016-03-18 ENCOUNTER — Encounter: Payer: Self-pay | Admitting: Obstetrics and Gynecology

## 2016-03-18 ENCOUNTER — Ambulatory Visit (INDEPENDENT_AMBULATORY_CARE_PROVIDER_SITE_OTHER): Payer: 59 | Admitting: Obstetrics and Gynecology

## 2016-03-18 VITALS — BP 138/70 | HR 80 | Temp 98.1°F | Resp 16 | Ht 62.0 in | Wt 145.0 lb

## 2016-03-18 DIAGNOSIS — N83201 Unspecified ovarian cyst, right side: Secondary | ICD-10-CM | POA: Diagnosis not present

## 2016-03-18 MED ORDER — NORETHIN-ETH ESTRAD-FE BIPHAS 1 MG-10 MCG / 10 MCG PO TABS: 1.0000 | ORAL_TABLET | Freq: Every day | ORAL | Status: DC

## 2016-03-18 NOTE — Progress Notes (Signed)
GYNECOLOGY  VISIT   HPI: 50 y.o.   Married  Caucasian  female   G2P1011 with Patient's last menstrual period was 03/17/2016.   here for  Nausea and vomiting and abdominal pain before cycles. Occurred more than once. About the fourth time as per patient.   Pelvic ultrasound 01/16/16 Pelvic ultrasound images and report reviewed with patient.  Uterus - retroflexed. No masses. EMS - 11.18 mm. Ovaries - enlarged right ovary with multiple cystic areas and debris most consistent with endometriomas. Left ovary normal. Both ovaries adherent to uterine sidewalls. Free fluid - yes, small amount.  CA125 - 87 on 01/16/16.   Had GYN ONC consultation who recommended follow up here in July for a recheck of ovarian cyst and CA125.   Felt great for 8 weeks after GYN ONC visit.  Did loose 10 pounds.  Has had nausea and vomiting prior to starting menses.  Had a large bowel movement last night and menses started, and now she is feeling better. Emotional lability.  Wants relief of her symptoms but does not want surgery.  GYNECOLOGIC HISTORY: Patient's last menstrual period was 03/17/2016. Contraception:  Peri-menopause Menopausal hormone therapy:  none Last mammogram:  09-20-14 abnormal Lt.breast and had recall;Had Lt.Diag.benign calcifications left breast/BiRads2/return to yearly screening:[facility]. Last pap smear:   09/12/15 Pap and HR HPV negative Unable to void today        OB History    Gravida Para Term Preterm AB TAB SAB Ectopic Multiple Living   2 1 1  1 1    1          Patient Active Problem List   Diagnosis Date Noted  . Right ovarian cyst 02/07/2016  . Elevated CA-125 02/07/2016  . MOTION SICKNESS 10/07/2010  . NEVUS 11/07/2008  . ALLERGIC RHINITIS 11/07/2008  . CERVICAL RADICULOPATHY, LEFT 12/01/2007  . HYPOTHYROIDISM 11/28/2007  . HYPERLIPIDEMIA 11/28/2007  . GERD 11/28/2007  . OTHER ABNORMAL BLOOD CHEMISTRY 11/28/2007    Past Medical History  Diagnosis Date  .  GERD (gastroesophageal reflux disease)   . Hyperlipemia     NMR 2008, LDL 123(2539/1568) HDL 55, TG 179  . Heart murmur   . Hypothyroidism     hypothyroidism  . Anxiety   . Depression   . Infertility, female     Past Surgical History  Procedure Laterality Date  . Cholecystectomy  2011  . Cesarean section  1993    Current Outpatient Prescriptions  Medication Sig Dispense Refill  . ASTAXANTHIN PO Take 1 tablet by mouth every morning.    Marland Kitchen KRILL OIL PO Take by mouth. 3 by mouth daily    . lactobacillus acidophilus (BACID) TABS tablet Take 2 tablets by mouth daily.    Marland Kitchen levothyroxine (SYNTHROID, LEVOTHROID) 75 MCG tablet Take 75 mcg by mouth daily before breakfast.    . MISC NATURAL PRODUCTS PO Take 1 tablet by mouth 2 (two) times daily. pueraria mirifica plus    . Nutritional Supplements (JOINT FORMULA PO) Take 1 tablet by mouth every morning.    . RABEprazole (ACIPHEX) 20 MG tablet 1 by mouth daily 90 tablet 3   No current facility-administered medications for this visit.     ALLERGIES: Amoxicillin-pot clavulanate; Sulfamethoxazole-trimethoprim; and Augmentin  Family History  Problem Relation Age of Onset  . Diabetes Father   . Hypertension Father   . Diabetes Mother   . Hypertension Mother   . Thyroid disease Mother     Graves Disease  . Heart attack Maternal  Grandfather   . Heart attack Paternal Grandfather   . Coronary artery disease Paternal Grandfather   . Breast cancer Paternal Grandmother   . Diabetes Maternal Grandmother   . Hypertension Maternal Grandmother     Social History   Social History  . Marital Status: Married    Spouse Name: N/A  . Number of Children: N/A  . Years of Education: N/A   Occupational History  . Not on file.   Social History Main Topics  . Smoking status: Never Smoker   . Smokeless tobacco: Not on file  . Alcohol Use: 1.8 oz/week    3 Standard drinks or equivalent per week  . Drug Use: 7.00 per week    Special: Marijuana      Comment: Ingests nightly to help with sleep  . Sexual Activity:    Partners: Male    Patent examiner Protection: None   Other Topics Concern  . Not on file   Social History Narrative    ROS:  Pertinent items are noted in HPI.  PHYSICAL EXAMINATION:    BP 138/70 mmHg  Pulse 80  Temp(Src) 98.1 F (36.7 C)  Resp 16  Ht 5\' 2"  (1.575 m)  Wt 145 lb (65.772 kg)  BMI 26.51 kg/m2  LMP 03/17/2016    General appearance: alert, cooperative and appears stated age    Pelvic: External genitalia:  no lesions              Urethra:  normal appearing urethra with no masses, tenderness or lesions              Bartholins and Skenes: normal                 Vagina: normal appearing vagina with normal color and discharge, no lesions              Cervix: whitish area and nabothian cysts.  Menstrual flow.             Bimanual Exam:  Uterus:  normal size, contour, position, consistency, mobility, non-tender              Adnexa: fullness in right andexa.  Nontender.  No left adnexal mass or tenderness.             Chaperone was present for exam.  ASSESSMENT  Right ovarian cyst.  Probable endometriosis.  Mildly elevated CA125.  Following conservatively after had GYN ONC consultation.   PLAN  Start LoLoEstrin.  Risks and benefits reviewed.  Patient has no contraindications.  Follow up in one month for ultrasound and repeat CA125.  She understands the only way to rule out malignancy is to proceed with surgical removal of the ovaries and tubes and possible hysterectomy.   An After Visit Summary was printed and given to the patient.  __25____ minutes face to face time of which over 50% was spent in counseling.

## 2016-03-19 ENCOUNTER — Telehealth: Payer: Self-pay | Admitting: Obstetrics and Gynecology

## 2016-03-19 MED ORDER — NORETHIN ACE-ETH ESTRAD-FE 1-20 MG-MCG PO TABS: 1.0000 | ORAL_TABLET | Freq: Every day | ORAL | Status: DC

## 2016-03-19 NOTE — Telephone Encounter (Signed)
Left message to call Ijeoma Loor at [phone]. 

## 2016-03-19 NOTE — Telephone Encounter (Signed)
Patient wants to talk with the nurse. She would like to get a different type of birth control due to cost.

## 2016-03-19 NOTE — Telephone Encounter (Signed)
Spoke with patient. Patient states rx for Lo Loestrin Fe is going to be $110. Reports Dr.Silva advised her to contact the office if this medication would be too expensive. Asking when Dr.Silva feels she should have her Ca125 done after she starts to feel better. "I do not want to wait too long, but I want to feel better before I have it done." Advised I will speak with Dr.Silva and return call with further recommendations. She is agreeable.

## 2016-03-19 NOTE — Telephone Encounter (Signed)
Let's switch OCPs to LoEstrin 1/20.  I discussed this alternative if the LoLoEstrin were too expensive.  OK to 3 months to pharmacy of choice.   Patient is due to a pelvic ultrasound and CA125 in 1 month in the office with me. This was Dr. Serita Grit recommendation, which represents a 3 month recheck from the last utrasound/CA125 evaluation.

## 2016-03-19 NOTE — Telephone Encounter (Signed)
Spoke with patient. Advised of message as seen below from [place]. Patient is agreeable and verbalizes understanding. Rx for LoEstrin 1/20 #3 0RF sent to pharmacy on file.   Routing to provider for final review. Patient agreeable to disposition. Will close encounter.

## 2016-03-26 ENCOUNTER — Encounter: Payer: Self-pay | Admitting: Family Medicine

## 2016-03-26 ENCOUNTER — Ambulatory Visit (INDEPENDENT_AMBULATORY_CARE_PROVIDER_SITE_OTHER): Payer: 59 | Admitting: Family Medicine

## 2016-03-26 VITALS — BP 130/90 | HR 86 | Temp 99.1°F | Resp 16 | Ht 62.0 in | Wt 147.2 lb

## 2016-03-26 DIAGNOSIS — R109 Unspecified abdominal pain: Secondary | ICD-10-CM | POA: Insufficient documentation

## 2016-03-26 DIAGNOSIS — R1084 Generalized abdominal pain: Secondary | ICD-10-CM

## 2016-03-26 DIAGNOSIS — F329 Major depressive disorder, single episode, unspecified: Secondary | ICD-10-CM | POA: Insufficient documentation

## 2016-03-26 DIAGNOSIS — F418 Other specified anxiety disorders: Secondary | ICD-10-CM

## 2016-03-26 DIAGNOSIS — F32A Depression, unspecified: Secondary | ICD-10-CM

## 2016-03-26 DIAGNOSIS — E782 Mixed hyperlipidemia: Secondary | ICD-10-CM | POA: Diagnosis not present

## 2016-03-26 DIAGNOSIS — F419 Anxiety disorder, unspecified: Secondary | ICD-10-CM

## 2016-03-26 LAB — CBC WITH DIFFERENTIAL/PLATELET
BASOS ABS: 0 {cells}/uL (ref 0–200)
BASOS PCT: 0 %
EOS ABS: 64 {cells}/uL (ref 15–500)
Eosinophils Relative: 1 %
HEMATOCRIT: 32.7 % — AB (ref 35.0–45.0)
Hemoglobin: 10.3 g/dL — ABNORMAL LOW (ref 11.7–15.5)
LYMPHS PCT: 26 %
Lymphs Abs: 1664 cells/uL (ref 850–3900)
MCH: 22.8 pg — ABNORMAL LOW (ref 27.0–33.0)
MCHC: 31.5 g/dL — ABNORMAL LOW (ref 32.0–36.0)
MCV: 72.3 fL — AB (ref 80.0–100.0)
MONO ABS: 640 {cells}/uL (ref 200–950)
MPV: 10.5 fL (ref 7.5–12.5)
Monocytes Relative: 10 %
NEUTROS ABS: 4032 {cells}/uL (ref 1500–7800)
Neutrophils Relative %: 63 %
PLATELETS: 299 10*3/uL (ref 140–400)
RBC: 4.52 MIL/uL (ref 3.80–5.10)
RDW: 18.1 % — AB (ref 11.0–15.0)
WBC: 6.4 10*3/uL (ref 3.8–10.8)

## 2016-03-26 LAB — LIPID PANEL
Cholesterol: 197 mg/dL (ref 125–200)
HDL: 62 mg/dL (ref >=46)
LDL CALC: 99 mg/dL (ref <130)
TRIGLYCERIDES: 179 mg/dL — AB (ref <150)
Total CHOL/HDL Ratio: 3.2 Ratio (ref <=5.0)
VLDL: 36 mg/dL — AB (ref <30)

## 2016-03-26 LAB — HEPATIC FUNCTION PANEL
ALK PHOS: 34 U/L (ref 33–130)
ALT: 30 U/L — AB (ref 6–29)
AST: 28 U/L (ref 10–35)
Albumin: 4.2 g/dL (ref 3.6–5.1)
BILIRUBIN DIRECT: 0 mg/dL (ref <=0.2)
BILIRUBIN INDIRECT: 0.2 mg/dL (ref 0.2–1.2)
Total Bilirubin: 0.2 mg/dL (ref 0.2–1.2)
Total Protein: 7 g/dL (ref 6.1–8.1)

## 2016-03-26 LAB — BASIC METABOLIC PANEL
BUN: 11 mg/dL (ref 7–25)
CALCIUM: 8.9 mg/dL (ref 8.6–10.4)
CO2: 22 mmol/L (ref 20–31)
CREATININE: 0.91 mg/dL (ref 0.50–1.05)
Chloride: 103 mmol/L (ref 98–110)
Glucose, Bld: 100 mg/dL — ABNORMAL HIGH (ref 65–99)
Potassium: 3.7 mmol/L (ref 3.5–5.3)
Sodium: 135 mmol/L (ref 135–146)

## 2016-03-26 LAB — TSH: TSH: 1.99 m[IU]/L

## 2016-03-26 NOTE — Progress Notes (Signed)
   Subjective:    Patient ID: Lauren Mccullough, female    DOB: 02/09/1966, 50 y.o.   MRN: [id number]  HPI New to establish.  Previous MD- [facility]  GYN- Quincy Simmonds, UTD on pap.  Due for mammo at [facility] through Dr Quincy Simmonds.    'i have been sick this whole year'- pt began having 'strange episodes' in March 'where my stomach would blow up w/ gas'.  This would coincide w/ ovulating.  + endometriosis.  Plan was to take NSAIDs as needed for cramping.  + vomiting this month.  Has lost 20 lbs since year began.  When she has these episodes she gets very anxious and 'extremely depressed'.  Pt was started on OCPs by GYN 1 week ago to attempt to suppress ovulation.  Pt reports poor sleep, hx of chronic anxiety but worse recently.  Pt was previously on Zoloft which helped w/ sleep but caused 50 lb weight gain.  Pt reports she is 'phobic' of doctors and medications and medical procedures.  Refusing colonoscopy, cologuard b/c 'i don't think it would motivate me to do anything anyway'.  'i just want to feel good and live my life'.   Review of Systems For ROS see HPI     Objective:   Physical Exam  Constitutional: She is oriented to person, place, and time. She appears well-developed and well-nourished. No distress.  HENT:  Head: Normocephalic and atraumatic.  Right Ear: Tympanic membrane normal.  Left Ear: Tympanic membrane normal.  Nose: Mucosal edema and rhinorrhea present. Right sinus exhibits no maxillary sinus tenderness and no frontal sinus tenderness. Left sinus exhibits no maxillary sinus tenderness and no frontal sinus tenderness.  Mouth/Throat: Mucous membranes are normal. Posterior oropharyngeal erythema (w/ PND) present.  Eyes: Conjunctivae and EOM are normal. Pupils are equal, round, and reactive to light.  Neck: Normal range of motion. Neck supple.  Cardiovascular: Normal rate, regular rhythm and normal heart sounds.   Pulmonary/Chest: Effort normal and breath sounds normal. No respiratory distress. She has  no wheezes. She has no rales.  Abdominal: Soft. Bowel sounds are normal. She exhibits no distension. There is no tenderness. There is no rebound and no guarding.  Lymphadenopathy:    She has no cervical adenopathy.  Neurological: She is alert and oriented to person, place, and time.  Skin: Skin is warm and dry.  Psychiatric: She has a normal mood and affect. Her behavior is normal.  Vitals reviewed.         Assessment & Plan:

## 2016-03-26 NOTE — Patient Instructions (Signed)
Schedule your complete physical in 6 months We'll notify you of your lab results and make any changes if needed (we'll also fax your form) Continue to work on healthy diet and regular exercise Try and find ways to reduce anxiety w/ stress management- if this isn't working, let me know Call with any questions or concerns Hang in there!!!

## 2016-03-26 NOTE — Progress Notes (Signed)
Pre visit review using our clinic review tool, if applicable. No additional management support is needed unless otherwise documented below in the visit note. 

## 2016-03-27 ENCOUNTER — Other Ambulatory Visit: Payer: Self-pay | Admitting: Family Medicine

## 2016-03-27 ENCOUNTER — Encounter: Payer: Self-pay | Admitting: Family Medicine

## 2016-03-27 ENCOUNTER — Telehealth: Payer: Self-pay | Admitting: Family Medicine

## 2016-03-27 DIAGNOSIS — D509 Iron deficiency anemia, unspecified: Secondary | ICD-10-CM

## 2016-03-27 MED ORDER — RABEPRAZOLE SODIUM 20 MG PO TBEC: DELAYED_RELEASE_TABLET | ORAL | Status: AC

## 2016-03-27 NOTE — Telephone Encounter (Signed)
Pt states that she needs refills on aciphex, [facility]. Pt was not aware that she was running out when she came in yesterday

## 2016-03-27 NOTE — Telephone Encounter (Signed)
Medication filled to pharmacy as requested.   

## 2016-03-30 NOTE — Assessment & Plan Note (Signed)
Pt has hx of this.  Now controlling w/ healthy diet and regular exercise.  Check labs.  Start meds prn.  Will follow.

## 2016-03-30 NOTE — Assessment & Plan Note (Signed)
New.  Pt reports depression but is not interested in taking medication for this.  Denies SI/HI.  Hx of weight gain on Zoloft and b/c of this, prefers to handle her sxs w/o medication.  Encouraged counseling- pt states she will think about it.  Reviewed stress management- exercise, meditation, art, etc.  Will follow.

## 2016-03-30 NOTE — Assessment & Plan Note (Signed)
New.  Pt is in midst of GYN work up for abd pain and endometriosis.  Was started on OCPs 1 week in attempts to control sxs.  Pt thinks she is feeling better but 'can't be sure' since she just started medication.  Will follow along and assist as able.

## 2016-04-09 ENCOUNTER — Telehealth: Payer: Self-pay | Admitting: Obstetrics and Gynecology

## 2016-04-09 NOTE — Telephone Encounter (Signed)
Called patient to review benefits for a recommended procedure. Left Voicemail requesting a call back. °

## 2016-04-15 NOTE — Telephone Encounter (Signed)
Spoke with pt regarding benefit for ultrasound. Patient understood and agreeable. Patient ready to schedule. Patient scheduled 04/16/16 with Dr Quincy Simmonds. Pt aware of arrival date and time. Pt aware of 72 hours cancellation policy. No further questions. Ok to close

## 2016-04-16 ENCOUNTER — Encounter: Payer: Self-pay | Admitting: Obstetrics and Gynecology

## 2016-04-16 ENCOUNTER — Ambulatory Visit (INDEPENDENT_AMBULATORY_CARE_PROVIDER_SITE_OTHER): Payer: 59

## 2016-04-16 ENCOUNTER — Ambulatory Visit (INDEPENDENT_AMBULATORY_CARE_PROVIDER_SITE_OTHER): Payer: 59 | Admitting: Obstetrics and Gynecology

## 2016-04-16 VITALS — BP 130/82 | HR 76 | Ht 62.0 in | Wt 145.0 lb

## 2016-04-16 DIAGNOSIS — R978 Other abnormal tumor markers: Secondary | ICD-10-CM | POA: Diagnosis not present

## 2016-04-16 DIAGNOSIS — N83201 Unspecified ovarian cyst, right side: Secondary | ICD-10-CM

## 2016-04-16 MED ORDER — NORETHIN ACE-ETH ESTRAD-FE 1-20 MG-MCG PO TABS: 1.0000 | ORAL_TABLET | Freq: Every day | ORAL | Status: DC

## 2016-04-16 NOTE — Progress Notes (Signed)
GYNECOLOGY  VISIT   HPI: 50 y.o.   Married  Caucasian  female   G2P1011 with Patient's last menstrual period was 03/17/2016.   here for   Recheck of right ovary.  Chronic right ovarian cyst.  Feeling much better since starting birth control pills one month ago.  On LoEstrin 1/20 continuous contraception. Also doing accupuncture.  Better mood control.  No stomach upset.  Only crampy once.  Used NSAID. Libido is improved.  Really does not want surgery if she does not need it.  Has been followed very closely by her prior GYN for ovarian cysts for a long time.  I did ask for the patient to see GYN ONC on 02/07/16 due to the complex right ovarian cyst and newly elevated CA125 of 87 on 01/16/16. The risk of malignancy was thought to be low due to the chronic nature of the right ovarian cyst and patient history consistent with endometriosis.  Plan was for a 3 month follow up, which is the reason for today's visit.   Did lose 25 pounds over the last 6 months.  Was watching her diet but it came off more easily than she expected. Had a lot of vomiting due to pain from endometriosis.  Feels much better now and is gaining weight slowly back.  GYNECOLOGIC HISTORY: Patient's last menstrual period was 03/17/2016. Contraception:  Combined OCPs.         OB History    Gravida Para Term Preterm AB TAB SAB Ectopic Multiple Living   2 1 1  1 1    1          Patient Active Problem List   Diagnosis Date Noted  . Anxiety and depression 03/26/2016  . Abdominal pain 03/26/2016  . Right ovarian cyst 02/07/2016  . Elevated CA-125 02/07/2016  . MOTION SICKNESS 10/07/2010  . NEVUS 11/07/2008  . ALLERGIC RHINITIS 11/07/2008  . CERVICAL RADICULOPATHY, LEFT 12/01/2007  . HYPOTHYROIDISM 11/28/2007  . HYPERLIPIDEMIA 11/28/2007  . GERD 11/28/2007  . OTHER ABNORMAL BLOOD CHEMISTRY 11/28/2007    Past Medical History  Diagnosis Date  . GERD (gastroesophageal reflux disease)   . Hyperlipemia     NMR 2008,  LDL 123(2539/1568) HDL 55, TG 179  . Heart murmur   . Hypothyroidism     hypothyroidism  . Anxiety   . Depression   . Infertility, female     Past Surgical History  Procedure Laterality Date  . Cholecystectomy  2011  . Cesarean section  1993    Current Outpatient Prescriptions  Medication Sig Dispense Refill  . ASTAXANTHIN PO Take 1 tablet by mouth every morning.    Marland Kitchen KRILL OIL PO Take by mouth. 3 by mouth daily    . lactobacillus acidophilus (BACID) TABS tablet Take 2 tablets by mouth daily.    Marland Kitchen MISC NATURAL PRODUCTS PO Take 1 tablet by mouth 2 (two) times daily. pueraria mirifica plus    . norethindrone-ethinyl estradiol (JUNEL FE,GILDESS FE,LOESTRIN FE) 1-20 MG-MCG tablet Take 1 tablet by mouth daily.    . Nutritional Supplements (JOINT FORMULA PO) Take 1 tablet by mouth every morning.    . RABEprazole (ACIPHEX) 20 MG tablet 1 by mouth daily 90 tablet 3  . SYNTHROID 75 MCG tablet Take 75 mcg by mouth daily before breakfast.     No current facility-administered medications for this visit.     ALLERGIES: Augmentin; Amoxicillin-pot clavulanate; and Sulfamethoxazole-trimethoprim  Family History  Problem Relation Age of Onset  . Diabetes Father   .  Hypertension Father   . Diabetes Mother   . Hypertension Mother   . Thyroid disease Mother     Graves Disease  . Heart attack Maternal Grandfather   . Heart attack Paternal Grandfather   . Coronary artery disease Paternal Grandfather   . Breast cancer Paternal Grandmother   . Diabetes Maternal Grandmother   . Hypertension Maternal Grandmother     Social History   Social History  . Marital Status: Married    Spouse Name: N/A  . Number of Children: N/A  . Years of Education: N/A   Occupational History  . Not on file.   Social History Main Topics  . Smoking status: Never Smoker   . Smokeless tobacco: Not on file  . Alcohol Use: 1.8 oz/week    3 Standard drinks or equivalent per week  . Drug Use: 7.00 per week     Special: Marijuana     Comment: Ingests nightly to help with sleep  . Sexual Activity:    Partners: Male    Patent examiner Protection: None   Other Topics Concern  . Not on file   Social History Narrative    ROS:  Pertinent items are noted in HPI.  PHYSICAL EXAMINATION:    BP 130/82 mmHg  Pulse 76  Ht 5\' 2"  (1.575 m)  Wt 145 lb (65.772 kg)  BMI 26.51 kg/m2  LMP 03/17/2016    General appearance: alert, cooperative and appears stated age  Pelvic ultrasound -  Uterus no masses.  EMS 7.38 mm.  Right ovary - multiple cysts.   Largest cyst 65 x 59 x 40 mm, increased in size since prior ultrasound.  Avascular, smooth, echofree. 2nd cyst 33 x 27 x 31 mm. Avascular, smooth, internal echoes. 3rd cyst 19 x 11 mm.  Avascular, smooth. Left ovary normal and seen transabdominally only.  Bilateral ovaries adherent to the uterus. No free fluid.   ASSESSMENT  Chronic complex right ovarian cyst.   History is consistent with endometriosis. Possibility of cystadenoma cannot be excluded. Recent elevated CA125.  Patient improved on combined oral contraception.   PLAN  Review of ovarian cysts and patient's care plan.  Will check Ca125 today.  If elevated, back to Dr. Denman George for surgical consultation.  We reviewed torsion precautions.  At a minimum, patient will need a follow up ultrasound in Sept. 2017. Continue with continuous OCPs.    An After Visit Summary was printed and given to the patient.  __25____ minutes face to face time of which over 50% was spent in counseling.

## 2016-04-17 LAB — CA 125: CA 125: 29 U/mL (ref <35)

## 2016-04-28 ENCOUNTER — Encounter: Payer: Self-pay | Admitting: Obstetrics and Gynecology

## 2016-04-28 ENCOUNTER — Telehealth: Payer: Self-pay

## 2016-04-28 NOTE — Telephone Encounter (Signed)
Telephone encounter created to discuss mychart message with Dr.Silva. 

## 2016-04-28 NOTE — Telephone Encounter (Signed)
Visit Follow-Up Question  Message [id number]  From [facility] To Nunzio Cobbs, MD Sent 04/28/2016 4:43 PM  Hi Dr. Quincy Simmonds - Just tried calling at 4:30, but office is closed, though recording says office is open until 5?    Anyway, just wanted to touch base with you, as I believe I am spotting. I didnt think much of it, but wanted to let you know in case.   I had sex on Friday night and was bleeding (lightly) which had stopped by Sat morning, so i thought maybe my husband had accidentally scratched me or whatever, I was not uncomfortable.   Since then, it has seemed that the toilet paper was a little miscolored maybe once a day or every other day. Today it is moreso, though I do not think I would need to use a tampon or anything (so far) but it is more colored than previous times since Saturday on.    Im on the last of the 3 pack of pills, week 3, with just 2 pills left in the pack not counting the placebo row.   I didnt think it an emergency to call the # and I will try the office tomorrow morning, but did want to let you know in case you have instructions for me.    Thanks -  [employer]   Responsible Party   Pool - [facility] Pool No one has taken responsibility for this message.  No actions have been taken on this message.   Routing to Oliver fore review and advise.

## 2016-04-29 NOTE — Telephone Encounter (Signed)
Please offer the patient an appointment with me.  I am not sure if it is a cervical polyp, small laceration, or breakthrough bleeding on the pills. It is reassuring if she is not having pain.  Her recent pelvic ultrasound showed a normal endometrial lining.

## 2016-04-29 NOTE — Telephone Encounter (Signed)
Message left to return call to Shiah Berhow at [phone].    

## 2016-04-29 NOTE — Telephone Encounter (Signed)
Spoke with patient. She continues to have some spotting on toilet tissue intermittently. No pain or dysuria, no fevers or chills.  She will monitor spotting and call back if worsens.  Offered office visit today and patient declines due to her schedule.  Office visit scheduled for 05/01/16 at 0815 with Dr. Quincy Simmonds.  Routing to provider for final review. Patient agreeable to disposition. Will close encounter.

## 2016-05-01 ENCOUNTER — Ambulatory Visit (INDEPENDENT_AMBULATORY_CARE_PROVIDER_SITE_OTHER): Payer: 59 | Admitting: Obstetrics and Gynecology

## 2016-05-01 ENCOUNTER — Encounter: Payer: Self-pay | Admitting: Obstetrics and Gynecology

## 2016-05-01 VITALS — BP 140/82 | HR 76 | Ht 62.0 in | Wt 145.6 lb

## 2016-05-01 DIAGNOSIS — N926 Irregular menstruation, unspecified: Secondary | ICD-10-CM | POA: Diagnosis not present

## 2016-05-01 NOTE — Progress Notes (Signed)
GYNECOLOGY  VISIT   HPI: 50 y.o.   Married  Caucasian  female   Lauren Mccullough with No LMP recorded.  LMP was bout 03/20/16. here for vaginal bleeding which began after intercourse on 04-24-16 and patient states has been spotting since. Had an increase in acne just after this. Some back aching.  Wonders if this is really a period. Intercourse not painful.  On continuous combined OCPs.   Pelvic pain is improved. Feeling better emotionally.  Being followed for a complex right ovarian cyst.  Uterus normal with ultrasound on 04/16/16. Now has a normal CA125.  Has plan for follow up ultrasound in September.  GYNECOLOGIC HISTORY: No LMP recorded. Contraception:  OCP--Junel--takes continuously Menopausal hormone therapy:  none Last mammogram:  09/2014 Bil.mmg with Dr. Jinger Neighbors views of Lt.09-20-14 benign calcifications upper-outer quadrant Lt.br./[facility] Last pap smear:   09-12-15 Neg:Neg HR HPV        OB History    Gravida Para Term Preterm AB Living   2 1 1   1 1    SAB TAB Ectopic Multiple Live Births     1               Patient Active Problem List   Diagnosis Date Noted  . Anxiety and depression 03/26/2016  . Abdominal pain 03/26/2016  . Right ovarian cyst 02/07/2016  . Elevated CA-125 02/07/2016  . MOTION SICKNESS 10/07/2010  . NEVUS 11/07/2008  . ALLERGIC RHINITIS 11/07/2008  . CERVICAL RADICULOPATHY, LEFT 12/01/2007  . HYPOTHYROIDISM 11/28/2007  . HYPERLIPIDEMIA 11/28/2007  . GERD 11/28/2007  . OTHER ABNORMAL BLOOD CHEMISTRY 11/28/2007    Past Medical History:  Diagnosis Date  . Anxiety   . Depression   . GERD (gastroesophageal reflux disease)   . Heart murmur   . Hyperlipemia    NMR 2008, LDL 123(2539/1568) HDL 55, TG 179  . Hypothyroidism    hypothyroidism  . Infertility, female     Past Surgical History:  Procedure Laterality Date  . CESAREAN SECTION  1993  . CHOLECYSTECTOMY  2011    Current Outpatient Prescriptions  Medication Sig  Dispense Refill  . ASTAXANTHIN PO Take 1 tablet by mouth every morning.    Marland Kitchen KRILL OIL PO Take by mouth. 3 by mouth daily    . lactobacillus acidophilus (BACID) TABS tablet Take 2 tablets by mouth daily.    . norethindrone-ethinyl estradiol (JUNEL FE,GILDESS FE,LOESTRIN FE) 1-20 MG-MCG tablet Take 1 tablet by mouth daily. 3 Package 0  . Nutritional Supplements (JOINT FORMULA PO) Take 1 tablet by mouth every morning.    . RABEprazole (ACIPHEX) 20 MG tablet 1 by mouth daily 90 tablet 3  . SYNTHROID 75 MCG tablet Take 75 mcg by mouth daily before breakfast.     No current facility-administered medications for this visit.      ALLERGIES: Augmentin [amoxicillin-pot clavulanate]; Amoxicillin-pot clavulanate; and Sulfamethoxazole-trimethoprim  Family History  Problem Relation Age of Onset  . Diabetes Father   . Hypertension Father   . Diabetes Mother   . Hypertension Mother   . Thyroid disease Mother     Graves Disease  . Heart attack Maternal Grandfather   . Heart attack Paternal Grandfather   . Coronary artery disease Paternal Grandfather   . Breast cancer Paternal Grandmother   . Diabetes Maternal Grandmother   . Hypertension Maternal Grandmother     Social History   Social History  . Marital status: Married    Spouse name: N/A  . Number of children:  N/A  . Years of education: N/A   Occupational History  . Not on file.   Social History Main Topics  . Smoking status: Never Smoker  . Smokeless tobacco: Never Used  . Alcohol use 1.8 oz/week    3 Standard drinks or equivalent per week  . Drug use:     Frequency: 7.0 times per week    Types: Marijuana     Comment: Ingests nightly to help with sleep  . Sexual activity: Yes    Partners: Male    Birth control/ protection: None   Other Topics Concern  . Not on file   Social History Narrative  . No narrative on file    ROS:  Pertinent items are noted in HPI.  PHYSICAL EXAMINATION:    BP 140/82 (BP Location: Right  Arm, Patient Position: Sitting, Cuff Size: Normal)   Pulse 76   Ht 5\' 2"  (1.575 m)   Wt 145 lb 9.6 oz (66 kg)   BMI 26.63 kg/m     General appearance: alert, cooperative and appears stated age    Pelvic: External genitalia:  no lesions              Urethra:  normal appearing urethra with no masses, tenderness or lesions              Bartholins and Skenes: normal                 Vagina: normal appearing vagina with normal color and discharge, no lesions.  Blood in vagina.              Cervix: no lesions and nabothian cysts.               Bimanual Exam:  Uterus:  normal size, contour, position, consistency, mobility, non-tender              Adnexa: Right adnexal mass 5 - 6 cm.  Nontender.              Chaperone was present for exam.  ASSESSMENT  Break through bleeding on OCPs. Right ovarian cyst.  PLAN  Reassurance given.  Double up on OCPs for 5 days.  Then resume one pill by mouth daily.  Call for continued bleeding or recurrent breakthrough bleeding.  May need to go up to a 30 mcg pill if this continues to occur.  Pelvic ultrasound in September 2017.    An After Visit Summary was printed and given to the patient.  _15_____ minutes face to face time of which over 50% was spent in counseling.

## 2016-05-12 ENCOUNTER — Telehealth: Payer: Self-pay | Admitting: Obstetrics and Gynecology

## 2016-05-12 NOTE — Telephone Encounter (Signed)
Spoke with patient regarding the following. She states "this is not the answer I was hoping for. I would love to speak with Dr. Quincy Simmonds to have her opinion about it".

## 2016-05-12 NOTE — Telephone Encounter (Signed)
I would just have her finish out this pack and see if the abnormal bleeding persists in the next pack

## 2016-05-12 NOTE — Telephone Encounter (Signed)
Spoke with patient. Patient states she is currently taking Loestrin 1-20. Reports she was having BTB and was seen with Dr.Silva in July. Reports she doubled up on her pills for 5 days and then returned to taking 1 pill per day. Denies missing any pills or taking any pills late. On day 3 of taking 1 pill per day she began spotting again. Reports spotting has slowly increased and she is now having to wear a regular tampon. Advised patient per her last OV note we may need to look at placing her on a new OCP. Advised I will speak with covering provider as Dr.Silva is out of the office today regarding recommendations and return call. She is agreeable.  Cc: Dr.Silva

## 2016-05-12 NOTE — Telephone Encounter (Signed)
Patient says she has started spotting again.

## 2016-05-12 NOTE — Telephone Encounter (Signed)
Left message to call Lauren Mccullough at [phone]. 

## 2016-05-12 NOTE — Telephone Encounter (Signed)
Patient returning call.

## 2016-05-13 MED ORDER — NORETHIN ACE-ETH ESTRAD-FE 1.5-30 MG-MCG PO TABS: 1.0000 | ORAL_TABLET | Freq: Every day | ORAL | 0 refills | Status: DC

## 2016-05-13 NOTE — Telephone Encounter (Signed)
Spoke with patient. Advised of message as seen below from [place]. She is agreeable and verbalizes understanding. Rx for Loestrin 1.5/30 #1 0RF sent to [facility] on file. Patient will return call to the office after one month for refills if she is doing well with new dosage or before if she has any questions or further BTB. Appointment for PUS scheduled for 06/25/2016 at 8:30 am with 9 am consult with Dr.Silva. She is agreeable to date and time. Order was previously placed by Dr.Silva.  Routing to provider for final review. Patient agreeable to disposition. Will close encounter.

## 2016-05-13 NOTE — Telephone Encounter (Signed)
Have her continue two pills daily to finish the current pack of LoEstrin 1/20 as far as it will take her.  I then recommend switching to LoEstrin 1.5/30 directly without having a cycle, meaning skip the placebo pills at the end of the current pack of the LoEstrin 1/20. Patient needs to schedule her ultrasound for mid September to check her ovarian cyst.  Please make appointment.

## 2016-05-27 ENCOUNTER — Other Ambulatory Visit: Payer: Self-pay | Admitting: Obstetrics and Gynecology

## 2016-05-27 MED ORDER — NORETHIN ACE-ETH ESTRAD-FE 1.5-30 MG-MCG PO TABS: 1.0000 | ORAL_TABLET | Freq: Every day | ORAL | 0 refills | Status: DC

## 2016-05-27 NOTE — Telephone Encounter (Signed)
Medication refill request: Loestrin Last AEX:  09/12/15 BS Next AEX: 06/25/16 (PUS appointment) Last MMG (if hormonal medication request): n/a Refill authorized: 05/13/16 #1 Package 0R. Please advise. Thank you.  Patient states going out of town this weekend and will run out of medication before she returns.

## 2016-06-04 ENCOUNTER — Other Ambulatory Visit: Payer: Self-pay | Admitting: Obstetrics and Gynecology

## 2016-06-10 ENCOUNTER — Telehealth: Payer: Self-pay | Admitting: Obstetrics and Gynecology

## 2016-06-12 ENCOUNTER — Telehealth: Payer: Self-pay | Admitting: General Practice

## 2016-06-12 NOTE — Telephone Encounter (Signed)
Received a phone call from [place] that patient showed up to their department after having been told by someone at our office she could just show up and have chest x-ray completed.   I informed imaging that I was unaware of this and there were no notes in the system, care everywhere, or in media that explained why she was having this procedure completed.   Note forwarded to our front desk staff so that pt can come in for an office visit to receive some clarification and we can place order for [facility] to perform xray

## 2016-06-15 NOTE — Telephone Encounter (Signed)
I reached out to pt about appt, pt states that she went to [facility] and had a signed order to have chest Rx done. Pt states that she did not want to pay another co-pay to have the same thing done again. I advised pt that per KT, she could go to [facility] w/order and they should be able to take care of this for her

## 2016-06-19 ENCOUNTER — Other Ambulatory Visit: Payer: Self-pay | Admitting: Obstetrics and Gynecology

## 2016-06-19 MED ORDER — NORETHIN ACE-ETH ESTRAD-FE 1.5-30 MG-MCG PO TABS: 1.0000 | ORAL_TABLET | Freq: Every day | ORAL | 0 refills | Status: DC

## 2016-06-25 ENCOUNTER — Encounter: Payer: Self-pay | Admitting: Obstetrics and Gynecology

## 2016-06-25 ENCOUNTER — Other Ambulatory Visit: Payer: Self-pay | Admitting: Obstetrics and Gynecology

## 2016-06-25 ENCOUNTER — Ambulatory Visit (INDEPENDENT_AMBULATORY_CARE_PROVIDER_SITE_OTHER): Payer: 59

## 2016-06-25 ENCOUNTER — Ambulatory Visit (INDEPENDENT_AMBULATORY_CARE_PROVIDER_SITE_OTHER): Payer: 59 | Admitting: Obstetrics and Gynecology

## 2016-06-25 VITALS — BP 118/78 | HR 70 | Resp 16 | Ht 62.0 in | Wt 144.0 lb

## 2016-06-25 DIAGNOSIS — R7309 Other abnormal glucose: Secondary | ICD-10-CM | POA: Diagnosis not present

## 2016-06-25 DIAGNOSIS — N83201 Unspecified ovarian cyst, right side: Secondary | ICD-10-CM | POA: Diagnosis not present

## 2016-06-25 DIAGNOSIS — D509 Iron deficiency anemia, unspecified: Secondary | ICD-10-CM | POA: Diagnosis not present

## 2016-06-25 MED ORDER — NORETHIN ACE-ETH ESTRAD-FE 1.5-30 MG-MCG PO TABS: 1.0000 | ORAL_TABLET | Freq: Every day | ORAL | 0 refills | Status: DC

## 2016-06-25 NOTE — Progress Notes (Signed)
GYNECOLOGY  VISIT   HPI: 50 y.o.   Married  Caucasian  female   G2P1011 with Patient's last menstrual period was 03/20/2016.   here for   Pelvic ultrasound for a recheck of right ovarian cysts suspicious for endometriomas. Patient has wanted to avoid surgical intervention.  She did have a temporary rise in CA125 to a level of 87 on 01/16/16 and had consultation with GYN ONC who recommended close follow up.  She subsequently had a decline in the CA125 down to 29 on 04/16/16.  Pelvic ultrasound scan on 04/16/16: Uterus no masses.  EMS 7.38 mm.  Right ovary - multiple cysts.   Largest cyst 65 x 59 x 40 mm, increased in size since prior ultrasound.  Avascular, smooth, echofree. 2nd cyst 33 x 27 x 31 mm. Avascular, smooth, internal echoes. 3rd cyst 19 x 11 mm.  Avascular, smooth. Left ovary normal and seen transabdominally only.  Bilateral ovaries adherent to the uterus. No free fluid.   Patient is now on combined oral contraceptives since June 2017 for symptom control and feeling better overall.   Notes acne while on continuous OCPs LoEstrin 1.5/30. Spots occasionally. Some waves of nausea and vomiting accompanied by a hot flash.   Has some urge for a bowel movement at this time as well. Does not think it is caused by the estrogen in the pills.   No prior colonoscopy.  Declines this.  Has established care with [facility].  Dx with anemia and is taking Fe and vit C. Ferritin 4. Hgb 9.5 Hgb A1C 5.7.  GYNECOLOGIC HISTORY: Patient's last menstrual period was 03/20/2016. Contraception:  OCPs. Last mammogram:  Due now.  Last pap smear:  09/12/15 - negative and negative HR HPV.        OB History    Gravida Para Term Preterm AB Living   2 1 1   1 1    SAB TAB Ectopic Multiple Live Births     1               Patient Active Problem List   Diagnosis Date Noted  . Anxiety and depression 03/26/2016  . Abdominal pain 03/26/2016  . Right ovarian cyst 02/07/2016  . Elevated  CA-125 02/07/2016  . MOTION SICKNESS 10/07/2010  . NEVUS 11/07/2008  . ALLERGIC RHINITIS 11/07/2008  . CERVICAL RADICULOPATHY, LEFT 12/01/2007  . HYPOTHYROIDISM 11/28/2007  . HYPERLIPIDEMIA 11/28/2007  . GERD 11/28/2007  . OTHER ABNORMAL BLOOD CHEMISTRY 11/28/2007    Past Medical History:  Diagnosis Date  . Anemia   . Anxiety   . Depression   . GERD (gastroesophageal reflux disease)   . Heart murmur   . Hyperlipemia    NMR 2008, LDL 123(2539/1568) HDL 55, TG 179  . Hypothyroidism    hypothyroidism  . Infertility, female     Past Surgical History:  Procedure Laterality Date  . CESAREAN SECTION  1993  . CHOLECYSTECTOMY  2011    Current Outpatient Prescriptions  Medication Sig Dispense Refill  . Ascorbic Acid (VITAMIN C PO) Take by mouth.    . ASTAXANTHIN PO Take 1 tablet by mouth every morning.    . ferrous gluconate (FERGON) 324 MG tablet Take by mouth.    . fluticasone (FLONASE) 50 MCG/ACT nasal spray by Nasal route once daily.    Marland Kitchen KRILL OIL PO Take by mouth. 3 by mouth daily    . lactobacillus acidophilus (BACID) TABS tablet Take 2 tablets by mouth daily.    . norethindrone-ethinyl  estradiol-iron (LOESTRIN FE 1.5/30) 1.5-30 MG-MCG tablet Take 1 tablet by mouth daily. 1 Package 0  . Nutritional Supplements (JOINT FORMULA PO) Take 1 tablet by mouth every morning.    . RABEprazole (ACIPHEX) 20 MG tablet 1 by mouth daily 90 tablet 3  . SYNTHROID 75 MCG tablet Take 75 mcg by mouth daily before breakfast.    . UNABLE TO FIND 1 capsule once daily. Med Name: Dr. Anice Paganini Joint Formula     No current facility-administered medications for this visit.      ALLERGIES: Augmentin [amoxicillin-pot clavulanate]; Amoxicillin-pot clavulanate; and Sulfamethoxazole-trimethoprim  Family History  Problem Relation Age of Onset  . Diabetes Father   . Hypertension Father   . Diabetes Mother   . Hypertension Mother   . Thyroid disease Mother     Graves Disease  . Heart attack  Maternal Grandfather   . Heart attack Paternal Grandfather   . Coronary artery disease Paternal Grandfather   . Breast cancer Paternal Grandmother   . Diabetes Maternal Grandmother   . Hypertension Maternal Grandmother     Social History   Social History  . Marital status: Married    Spouse name: N/A  . Number of children: N/A  . Years of education: N/A   Occupational History  . Not on file.   Social History Main Topics  . Smoking status: Never Smoker  . Smokeless tobacco: Never Used  . Alcohol use 3.6 oz/week    6 Standard drinks or equivalent per week  . Drug use:     Frequency: 7.0 times per week    Types: Marijuana     Comment: Ingests nightly to help with sleep  . Sexual activity: Yes    Partners: Male    Birth control/ protection: None, Pill   Other Topics Concern  . Not on file   Social History Narrative  . No narrative on file    ROS:  Pertinent items are noted in HPI.  PHYSICAL EXAMINATION:    BP 118/78   Pulse 70   Resp 16   Ht 5\' 2"  (1.575 m)   Wt 144 lb (65.3 kg)   LMP 03/20/2016   BMI 26.34 kg/m     General appearance: alert, cooperative and appears stated age   Pelvic ultrasound today: Uterus - retroverted and no myometrial masses. EMS 4.18 mm. Right ovary - 8.82 x 4.48 cm. 3 cysts:  5.2, 3.03, and 1.5 cm.  Cystic debris noted.  Consistent with endometriomas.  Appearance unchanged from previous scan. Left ovary normal.  Both ovaries adherent to uterus.  No free fluid.  ASSESSMENT  Stable right ovarian cysts.  Endometriomas.  Right ovary is quite large overall.  On combined OCPs. Nausea episode and concomitant BMs. Symptoms all consistent with endometriosis.  No prior colonoscopy. Iron deficiency anemia.  Elevated HgbA1C.  PLAN  Discussion of endometriomas and expected lack of resolution with medical therapy.  Patient is aware of risk of torsion potential of a large ovary.  We did discuss the rare possibility of ovarian cancer  developing within an endometrioma. Patient declines surgical intervention. Continue OCPs. Pelvic ultrasound yearly, sooner if needed. I recommended colonoscopy.   She may follow up with PCP regarding alternatives to colonoscopy.    An After Visit Summary was printed and given to the patient.  __25____ minutes face to face time of which over 50% was spent in counseling.

## 2016-06-25 NOTE — Assessment & Plan Note (Signed)
3 right ovarian cysts consistent with endometriomas.

## 2016-06-25 NOTE — Progress Notes (Signed)
Opened in error

## 2016-07-08 NOTE — Telephone Encounter (Signed)
Patient wants to speak with the nurse no information given. °

## 2016-07-08 NOTE — Telephone Encounter (Signed)
Spoke with patient. Patient states she was in for OV 06/25/16 and started OCP. Patient reports that she has had breakthrough bleeding while on Loestrin Fe 1.5/30 since taking it. Patient states she has only had spotting up until yesterday. Patient states the bleeding is heavier today; wearing tampon, changing q4h. Patient states she has 1 week left of the Loestrin. Patient reports mild "period" cramps. Advised would review with Dr. Quincy Simmonds and return call with recommendations. Patient is agreeable.   Dr. Quincy Simmonds, please advise?

## 2016-07-08 NOTE — Telephone Encounter (Signed)
Options are: - double up on OCPs for 5 days and then return to taking the pills daily.  - Take the pills daily and not do this continuously.  Her body may not want to do continuous contraception.   The large endometrioma may also be playing a role in her cycle control.

## 2016-07-08 NOTE — Telephone Encounter (Signed)
Spoke with patient. Reviewed the recommended options as seen below per Dr. Quincy Simmonds. Patient states she will try doubling up on the OCP for 5 days and returning to taking the pills daily. Patient states she will follow-up with update. Advised to call office with questions/concerns.    Routing to provider for final review. Patient is agreeable to disposition. Will close encounter.

## 2016-07-08 NOTE — Telephone Encounter (Signed)
Patient calling to check on status of earlier call.

## 2016-07-09 ENCOUNTER — Encounter: Payer: Self-pay | Admitting: Obstetrics and Gynecology

## 2016-07-13 ENCOUNTER — Ambulatory Visit (INDEPENDENT_AMBULATORY_CARE_PROVIDER_SITE_OTHER): Payer: 59 | Admitting: Obstetrics and Gynecology

## 2016-07-13 ENCOUNTER — Encounter: Payer: Self-pay | Admitting: Obstetrics and Gynecology

## 2016-07-13 VITALS — BP 120/78 | HR 70 | Resp 16 | Ht 62.0 in | Wt 142.0 lb

## 2016-07-13 DIAGNOSIS — F4323 Adjustment disorder with mixed anxiety and depressed mood: Secondary | ICD-10-CM

## 2016-07-13 DIAGNOSIS — N83201 Unspecified ovarian cyst, right side: Secondary | ICD-10-CM | POA: Diagnosis not present

## 2016-07-13 DIAGNOSIS — N809 Endometriosis, unspecified: Secondary | ICD-10-CM

## 2016-07-13 DIAGNOSIS — N926 Irregular menstruation, unspecified: Secondary | ICD-10-CM | POA: Diagnosis not present

## 2016-07-13 DIAGNOSIS — E039 Hypothyroidism, unspecified: Secondary | ICD-10-CM | POA: Diagnosis not present

## 2016-07-13 LAB — POCT URINE PREGNANCY: Preg Test, Ur: NEGATIVE

## 2016-07-13 MED ORDER — DROSPIRENONE-ETHINYL ESTRADIOL 3-0.03 MG PO TABS: 1.0000 | ORAL_TABLET | Freq: Every day | ORAL | 2 refills | Status: DC

## 2016-07-13 NOTE — Progress Notes (Signed)
GYNECOLOGY  VISIT   HPI: 50 y.o.   Married  Caucasian  female   G2P1011 with Patient's last menstrual period was 07/08/2016.   here for consult regarding endometriotic cyst.  Patient states currently she has not been feeling well.  Clammy cold and constipation with her current iron regimen.   Feels she is suffering from anxiety and that she has not been adequately conveying this at her prior visits. In the later afternoon and evening, she feels anxious and unable to move.  Feels she cannot get herself out of a rut.  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 Having anxiety about medical care also.  Crying a lot since January and feeling depressed.  Denies suicidal ideation.  She has seen a therapist but not sure if it is helping her.  Hx of anxiety/depression in past.  Took Zoloft and had 50 pound weight gain.   Wonders if her thyroid is not functioning well.  States she is weak but not tired.   Patient has wanted to avoid surgery for her endometriosis.  She is not really having pain. She is having break through bleeding on LoEstrin 1.5/30.  She is currently doubling up on her OCPs to try to control this.  This does not bother her.  She did miss a pill recently.   She did have a temporary rise in CA125 to a level of 87 on 01/16/16 and had consultation with GYN ONC who recommended close follow up.  She subsequently had a decline in the CA125 down to 29 on 04/16/16.  Pelvic ultrasound scan on 04/16/16: Uterus no masses.  EMS 7.38 mm.  Right ovary - multiple cysts.  Largest cyst 65 x 59 x 40 mm, increased in size since prior ultrasound. Avascular, smooth, echofree. 2nd cyst 33 x 27 x 31 mm. Avascular, smooth, internal echoes. 3rd cyst 19 x 11 mm. Avascular, smooth. Left ovary normal and seen transabdominally only.  Bilateral ovaries adherent to the uterus. No free fluid.   No prior colonoscopy.  Declines this.  Has established care with  [facility].  Dx with anemia and is taking Fe and vit C. Ferritin 4. Hgb 9.5 Hgb A1C 5.7.  GYNECOLOGIC HISTORY: Patient's last menstrual period was 07/08/2016. Contraception:  ocp Menopausal hormone therapy:  n/a Last mammogram:  Due now. Last one 2015 Last pap smear:   09/12/15 neg HPV HR neg UPT today-neg    OB History    Gravida Para Term Preterm AB Living   2 1 1   1 1    SAB TAB Ectopic Multiple Live Births     1               Patient Active Problem List   Diagnosis Date Noted  . Elevated hemoglobin A1c 06/25/2016  . Anemia, iron deficiency 06/25/2016  . Anxiety and depression 03/26/2016  . Abdominal pain 03/26/2016  . Right ovarian cyst.  Probably endometriomas.  Do yearly ultrasound. 02/07/2016  . Elevated CA-125 02/07/2016  . MOTION SICKNESS 10/07/2010  . NEVUS 11/07/2008  . ALLERGIC RHINITIS 11/07/2008  . CERVICAL RADICULOPATHY, LEFT 12/01/2007  . HYPOTHYROIDISM 11/28/2007  . HYPERLIPIDEMIA 11/28/2007  . GERD 11/28/2007  . OTHER ABNORMAL BLOOD CHEMISTRY 11/28/2007    Past Medical History:  Diagnosis Date  . Anemia   . Anxiety   . Depression   . Elevated hemoglobin A1c   . GERD (gastroesophageal reflux disease)   . Heart murmur   .  Hyperlipemia    NMR 2008, LDL 123(2539/1568) HDL 55, TG 179  . Hypothyroidism    hypothyroidism  . Infertility, female   . Iron deficiency anemia   . Right ovarian cyst    probable endometriomas.  Do yearly ultrasound.    Past Surgical History:  Procedure Laterality Date  . CESAREAN SECTION  1993  . CHOLECYSTECTOMY  2011    Current Outpatient Prescriptions  Medication Sig Dispense Refill  . Ascorbic Acid (VITAMIN C PO) Take by mouth.    . ASTAXANTHIN PO Take 1 tablet by mouth every morning.    . fluticasone (FLONASE) 50 MCG/ACT nasal spray by Nasal route once daily.    Marland Kitchen KRILL OIL PO Take by mouth. 3 by mouth daily    . lactobacillus acidophilus (BACID) TABS tablet Take 2 tablets by mouth daily.    .  norethindrone-ethinyl estradiol-iron (LOESTRIN FE 1.5/30) 1.5-30 MG-MCG tablet Take 1 tablet by mouth daily. 3 Package 0  . Nutritional Supplements (JOINT FORMULA PO) Take 1 tablet by mouth every morning.    . RABEprazole (ACIPHEX) 20 MG tablet 1 by mouth daily 90 tablet 3  . SYNTHROID 75 MCG tablet Take 75 mcg by mouth daily before breakfast.    . UNABLE TO FIND 1 capsule once daily. Med Name: Dr. Anice Paganini Joint Formula    . UNABLE TO FIND Fumarate iron takes 2 a day     No current facility-administered medications for this visit.      ALLERGIES: Augmentin [amoxicillin-pot clavulanate]; Amoxicillin-pot clavulanate; and Sulfamethoxazole-trimethoprim  Family History  Problem Relation Age of Onset  . Diabetes Father   . Hypertension Father   . Diabetes Mother   . Hypertension Mother   . Thyroid disease Mother     Graves Disease  . Heart attack Maternal Grandfather   . Heart attack Paternal Grandfather   . Coronary artery disease Paternal Grandfather   . Breast cancer Paternal Grandmother   . Diabetes Maternal Grandmother   . Hypertension Maternal Grandmother     Social History   Social History  . Marital status: Married    Spouse name: N/A  . Number of children: N/A  . Years of education: N/A   Occupational History  . Not on file.   Social History Main Topics  . Smoking status: Never Smoker  . Smokeless tobacco: Never Used  . Alcohol use 3.6 oz/week    6 Standard drinks or equivalent per week  . Drug use:     Frequency: 7.0 times per week    Types: Marijuana     Comment: Ingests nightly to help with sleep  . Sexual activity: Yes    Partners: Male    Birth control/ protection: None, Pill   Other Topics Concern  . Not on file   Social History Narrative  . No narrative on file    ROS:  Pertinent items are noted in HPI.  PHYSICAL EXAMINATION:    BP 120/78   Pulse 70   Resp 16   Ht 5\' 2"  (1.575 m)   Wt 142 lb (64.4 kg)   LMP 07/08/2016 Comment: spotting  before it  BMI 25.97 kg/m     General appearance: alert, cooperative and appears stated age   Pelvic: External genitalia:  no lesions              Urethra:  normal appearing urethra with no masses, tenderness or lesions  Bartholins and Skenes: normal                 Vagina: normal appearing vagina with normal color and discharge, no lesions              Cervix: no lesions                Bimanual Exam:  Uterus:  normal size, contour, position, consistency, mobility, non-tender              Adnexa:  largeelvic mass extending into the cul de sac.            Chaperone was present for exam.  ASSESSMENT  Anxiety/depression.  Fatigue.  Endometriosis.  Right ovarian endometriomas. Break through bleeding on Loestrin 1.5/30. Hypothyroidism.   PLAN  UPT now - negative. Discussion of depression and anxiety.   Will refer to psychiatry at [facility].  Switch to [facility].   I discussed with patient that her endometriosis is likely having an impact on her health overall, and that I though she would eventually likely need surgical intervention.  Will check TFT today. Follow up in 3 months, sooner as needed.   An After Visit Summary was printed and given to the patient.  _40_____ minutes face to face time of which over 50% was spent in counseling.

## 2016-07-14 LAB — THYROID PANEL WITH TSH
FREE THYROXINE INDEX: 3.2 (ref 1.4–3.8)
T3 UPTAKE: 23 % (ref 22–35)
T4 TOTAL: 14.1 ug/dL — AB (ref 4.5–12.0)
TSH: 1.58 m[IU]/L

## 2016-07-15 ENCOUNTER — Encounter: Payer: Self-pay | Admitting: Obstetrics and Gynecology

## 2016-07-15 ENCOUNTER — Telehealth: Payer: Self-pay

## 2016-07-15 ENCOUNTER — Other Ambulatory Visit: Payer: Self-pay | Admitting: Obstetrics and Gynecology

## 2016-07-15 DIAGNOSIS — F329 Major depressive disorder, single episode, unspecified: Secondary | ICD-10-CM

## 2016-07-15 DIAGNOSIS — E039 Hypothyroidism, unspecified: Secondary | ICD-10-CM

## 2016-07-15 DIAGNOSIS — F411 Generalized anxiety disorder: Secondary | ICD-10-CM

## 2016-07-15 DIAGNOSIS — F32A Depression, unspecified: Secondary | ICD-10-CM

## 2016-07-15 MED ORDER — LEVOTHYROXINE SODIUM 50 MCG PO TABS: 50.0000 ug | ORAL_TABLET | Freq: Every day | ORAL | 2 refills | Status: DC

## 2016-07-15 MED ORDER — LEVOTHYROXINE SODIUM 50 MCG PO TABS: 50.0000 ug | ORAL_TABLET | Freq: Every day | ORAL | 0 refills | Status: DC

## 2016-07-15 NOTE — Telephone Encounter (Signed)
I am happy to give her a new prescription for Synthroid 50 mcg daily. I will send this to her pharmacy listed.  She will need to have a thyroid recheck in 6 weeks with repeat blood work at a lab visit.  I will place a future order. I would not wait until January to reassess this.   I am glad she can be seen in [place].

## 2016-07-15 NOTE — Telephone Encounter (Signed)
Notified patient referral has been placed to [facility] and she will be contacted directly to schedule an appointment. Notified of MyChart message as seen below from [place]. Patient states that Dr.Metzer was managing her thyroid and he retired so she does not have a provider prescribing her medication at this time. Asking what she may do about her rx until she is able to get an appointment for management. Advised I will speak with Dr.Silva and return call.  Notes Recorded by Nunzio Cobbs, MD on 07/15/2016 at 4:16 PM EDT Results to patient through My Chart.  Hi Lauren Mccullough,   Your thyroid does look a little overactive.  Please contact your prescriber for orientation about potential dosage change.   This may be impacting how you feel overall.   Thanks,   [place]  Dr.Silva, would you like me to refer the patient to endocrinology for thyroid management at this time?

## 2016-07-15 NOTE — Telephone Encounter (Signed)
Spoke with [facility]. Per scheduler there are no available appointments until the first of the year with their facility. Reports [place] has availability within a week. Telephone number provided as [phone].Spoke with patient. Patient is agreeable to be seen in [place].  Call to [place]. Per Velva Harman referral must be placed in EPIC. The patient's chart will be reviewed by their providers and she will be contacted directly to schedule an appointment.

## 2016-07-15 NOTE — Telephone Encounter (Signed)
Spoke with patient. Patient states that she spoke with [facility] and was advised two of their providers accept [facility], but that they are not accepting new patients. She was advised to contact [place] to schedule an appointment. Offered to contact them directly to get her set up for an appointment . The patient is agreeable. Patient is requesting results from her thyroid panel done on 07/13/2016. Patient is on Synthroid 75 mcg daily. Advised I will have Dr.Silva review her results and will go over these with return call regarding appointment. She is agreeable.

## 2016-07-15 NOTE — Telephone Encounter (Signed)
Patient is returning a call to Kaitlyn. °

## 2016-07-15 NOTE — Telephone Encounter (Signed)
Left message to call [place] at [phone].  Call to follow up with patient regarding psychiatry appointment. When patient was in office the on 07/13/2016 [facility] was contacted. [facility] does not accept [facility]. Call to [place], voicemail left requesting return call to the office to facilitate scheduling. Per patient she wanted to contact [place] to discuss psychiatry groups that are in network with her plan and call to schedule her appointment. Asked patient to contact the office with appointment date and time. Calling to check on status and to see if referral is needed or if RN can provide assistance scheduling.

## 2016-07-16 NOTE — Telephone Encounter (Signed)
Spoke with patient. Advised of message as seen below from [place]. Patient is agreeable and verbalizes understanding. 6 week lab recheck scheduled for 08/21/2016 at 8:30 am. Patient is agreeable to date and time.   Routing to provider for final review. Patient agreeable to disposition. Will close encounter.

## 2016-07-16 NOTE — Telephone Encounter (Signed)
Left message to call Kaitlyn at [phone]. 

## 2016-07-24 ENCOUNTER — Ambulatory Visit (INDEPENDENT_AMBULATORY_CARE_PROVIDER_SITE_OTHER): Payer: 59 | Admitting: Psychiatry

## 2016-07-24 ENCOUNTER — Encounter (HOSPITAL_COMMUNITY): Payer: Self-pay | Admitting: Psychiatry

## 2016-07-24 VITALS — BP 130/80 | HR 74 | Ht 62.0 in | Wt 138.0 lb

## 2016-07-24 DIAGNOSIS — Z9889 Other specified postprocedural states: Secondary | ICD-10-CM

## 2016-07-24 DIAGNOSIS — F1290 Cannabis use, unspecified, uncomplicated: Secondary | ICD-10-CM

## 2016-07-24 DIAGNOSIS — Z882 Allergy status to sulfonamides status: Secondary | ICD-10-CM

## 2016-07-24 DIAGNOSIS — Z8349 Family history of other endocrine, nutritional and metabolic diseases: Secondary | ICD-10-CM

## 2016-07-24 DIAGNOSIS — F411 Generalized anxiety disorder: Secondary | ICD-10-CM

## 2016-07-24 DIAGNOSIS — Z79899 Other long term (current) drug therapy: Secondary | ICD-10-CM

## 2016-07-24 DIAGNOSIS — Z803 Family history of malignant neoplasm of breast: Secondary | ICD-10-CM

## 2016-07-24 DIAGNOSIS — Z888 Allergy status to other drugs, medicaments and biological substances status: Secondary | ICD-10-CM

## 2016-07-24 DIAGNOSIS — Z833 Family history of diabetes mellitus: Secondary | ICD-10-CM

## 2016-07-24 DIAGNOSIS — F39 Unspecified mood [affective] disorder: Secondary | ICD-10-CM

## 2016-07-24 DIAGNOSIS — Z8249 Family history of ischemic heart disease and other diseases of the circulatory system: Secondary | ICD-10-CM

## 2016-07-24 MED ORDER — ZOLPIDEM TARTRATE 10 MG PO TABS: 10.0000 mg | ORAL_TABLET | Freq: Every evening | ORAL | 0 refills | Status: DC | PRN

## 2016-07-24 MED ORDER — DULOXETINE HCL 30 MG PO CPEP: 30.0000 mg | ORAL_CAPSULE | Freq: Every day | ORAL | 0 refills | Status: DC
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
## 2016-08-10 ENCOUNTER — Telehealth: Payer: Self-pay | Admitting: Obstetrics and Gynecology

## 2016-08-10 NOTE — Telephone Encounter (Signed)
Spoke with patient. Patient scheduled for 08/13/16 at 0900 with Dr. Quincy Simmonds. Patient is agreeable to date and time.  Routing to provider for final review. Patient is agreeable to disposition. Will close encounter.

## 2016-08-10 NOTE — Telephone Encounter (Signed)
Appointment with me for this week, Nov 8 or Nov. 9.

## 2016-08-10 NOTE — Telephone Encounter (Signed)
Spoke with patient. Patient states she thought initially she needed to be seen but is feeling better to where she feels she does not need immediate appt. Patient states she has been feeling sick since 08/07/16. Patient reports, alternating between shaking with chills and hot, foggy head, no appetite, not sleeping well and reports additional 2lb weight loss. Patient reports weight at 135lbs. Patient reports heart racing in "spurts" lasting 10-76min, but "not as bad as it used to be".Patient states she did eat equivalent to 1.5 eggs this morning. Patient states she has felt better the last 3 weeks than the last 3 months until 08/07/16. Patient reports no breakthrough bleeding.Patient states she finished yaz pkg yesterday. Patient denies any pain. Patient asking if hormonal? or do I need a recheck of thyroid? Advised patient I would speak with Dr. Quincy Simmonds for recommendations and return call, patient is agreeable.   Dr. Quincy Simmonds, please advise?

## 2016-08-10 NOTE — Telephone Encounter (Signed)
Patient called and state she is "sick" and can't eat and has been in bed all weekend.  Patient states she is not in pain but she is weak and shaky and believes it may be from her endometriosis.

## 2016-08-12 ENCOUNTER — Telehealth: Payer: Self-pay | Admitting: *Deleted

## 2016-08-12 NOTE — Telephone Encounter (Signed)
Spoke with patient. Patient states she feels shaky, heart racing and has been sick all week. Patient states she is on verge of going to ER the last 2 days. [facility] -reports BP 150/99 and HR 109. Patient reports hot flashes and chills at the same time. RN asked patient if she has a PCP that she can be seen at, patient states PCP is at [facility]. Patient states she would prefer to see Dr. Quincy Simmonds of possible. Advised patient would need to review scheduling with Dr. Quincy Simmonds and return call. Patient states she is currently at home. Patient has appointment previously scheduled for 11/ at 0900. Patient is agreeable. Patient states started Cymbalta 30mg  daily 08/11/16.   Dr. Quincy Simmonds, please advise?

## 2016-08-12 NOTE — Telephone Encounter (Signed)
Return call to patient after speaking with Dr. Quincy Simmonds. Advised patient to seek care at local ER. Advised patient after being evaluated by ER, if symptoms are found to be gynecologicaly related, keep appt for 08/13/16 at 0900 with Dr. Quincy Simmonds. Patient verbalizes understanding and is agreeable. Patient states will f/u with office for update.   Dr. Quincy Simmonds, any additional recommendations? Ok to close encounter?

## 2016-08-12 NOTE — Telephone Encounter (Signed)
Patient returning call to ask which ER to go to. Advised patient to go to ER closest to her. Patient states she lives off of [address].  Provided locations of local ERs [place] and [facility]. Advised patient would be her choice of location. Patient verbalizes understanding and is agreeable.  Routing to provider for final review. Patient is agreeable to disposition. Will close encounter.

## 2016-08-13 ENCOUNTER — Telehealth: Payer: Self-pay | Admitting: Obstetrics and Gynecology

## 2016-08-13 ENCOUNTER — Telehealth (HOSPITAL_COMMUNITY): Payer: Self-pay | Admitting: *Deleted

## 2016-08-13 ENCOUNTER — Encounter: Payer: Self-pay | Admitting: Obstetrics and Gynecology

## 2016-08-13 ENCOUNTER — Ambulatory Visit (INDEPENDENT_AMBULATORY_CARE_PROVIDER_SITE_OTHER): Payer: 59 | Admitting: Obstetrics and Gynecology

## 2016-08-13 VITALS — BP 150/82 | HR 88 | Temp 98.1°F | Ht 62.0 in | Wt 137.2 lb

## 2016-08-13 DIAGNOSIS — R5381 Other malaise: Secondary | ICD-10-CM | POA: Diagnosis not present

## 2016-08-13 DIAGNOSIS — N83201 Unspecified ovarian cyst, right side: Secondary | ICD-10-CM | POA: Diagnosis not present

## 2016-08-13 MED ORDER — NORETHINDRONE 0.35 MG PO TABS: 1.0000 | ORAL_TABLET | Freq: Every day | ORAL | 2 refills | Status: DC

## 2016-08-13 NOTE — Telephone Encounter (Signed)
Pt was seen on 10/20. Pt states since starting Cymbalta 30mg , she is having tremors, increasing heart rate. Pt would like to try another medication. Please advise.

## 2016-08-13 NOTE — Telephone Encounter (Signed)
Spoke with patient. Patient [facility] [address] at [facility] [address] of [address]. Rx for Micronor #1 [facility]RF [facility] [address]. Patient is agreeable.  Routing to provider for final review. Patient agreeable to disposition. Will close encounter.

## 2016-08-13 NOTE — Progress Notes (Signed)
GYNECOLOGY  VISIT   HPI: 50 y.o.   Married  Caucasian  female   G2P1011 with Patient's last menstrual period was 08/12/2016 (exact date).   here stating heart racing, feels weak, has brain fog, nausea, decreased appetite and hands are "shakey feeling".  Patient denies any pelvic pain except for usual pain with menses.  Wants surgery to remove her ovary.  Patient was seen at [facility] yesterday and labs were drawn. She was given a Rx for Zofran for nausea and advised to return in 2 weeks.  5 nights ago started feeling shaky with heart racing.  She feels nausea and that her GI system has "shut down." She is having bowel movements.  Sweating it out and then having chills.  Arms feeling like are burning but she feels chilled.  Started taking Cymbalta for anxiety 3 days ago. All of these symptoms preceded Cymbalta.  Has not taking her Synthroid for the last 3 days.   Not taking the Yasmin this week because it is her period week.  Menses are regular on Yasmin.  Has an enlarged right ovary with cysts consistent with endometriosis.  Last ultrasound 06/25/16: Uterus - retroverted and no myometrial masses. EMS 4.18 mm. Right ovary - 8.82 x 4.48 cm. 3 cysts:  5.2, 3.03, and 1.5 cm.  Cystic debris noted.  Consistent with endometriomas.  Appearance unchanged from previous scan. Left ovary normal.  Both ovaries adherent to uterus.  No free fluid.  She did have a temporary rise in CA125 to a level of 87 on 01/16/16 and had consultation with GYN ONC who recommended close follow up.  She subsequently had a decline in the CA125 down to 29 on 04/16/16.   GYNECOLOGIC HISTORY: Patient's last menstrual period was 08/12/2016 (exact date). Contraception:  OCP--Yasmin Menopausal hormone therapy:  n/a Last mammogram:  2015 Lt.Diag.Recall--Density C/benign calcifications upper-outer quadrant/screening 1 yr/BiRads2:[facility] Last pap smear:   09-12-15 Neg:Neg HR HPV        OB History     Gravida Para Term Preterm AB Living   2 1 1   1 1    SAB TAB Ectopic Multiple Live Births     1               Patient Active Problem List   Diagnosis Date Noted  . Elevated hemoglobin A1c 06/25/2016  . Anemia, iron deficiency 06/25/2016  . Anxiety and depression 03/26/2016  . Abdominal pain 03/26/2016  . Right ovarian cyst.  Probably endometriomas.  Do yearly ultrasound. 02/07/2016  . Elevated CA-125 02/07/2016  . MOTION SICKNESS 10/07/2010  . NEVUS 11/07/2008  . ALLERGIC RHINITIS 11/07/2008  . CERVICAL RADICULOPATHY, LEFT 12/01/2007  . HYPOTHYROIDISM 11/28/2007  . HYPERLIPIDEMIA 11/28/2007  . GERD 11/28/2007  . OTHER ABNORMAL BLOOD CHEMISTRY 11/28/2007    Past Medical History:  Diagnosis Date  . Anemia   . Anxiety   . Depression   . Elevated hemoglobin A1c   . GERD (gastroesophageal reflux disease)   . Heart murmur   . Hyperlipemia    NMR 2008, LDL 123(2539/1568) HDL 55, TG 179  . Hypothyroidism    hypothyroidism  . Infertility, female   . Iron deficiency anemia   . Right ovarian cyst    probable endometriomas.  Do yearly ultrasound.    Past Surgical History:  Procedure Laterality Date  . CESAREAN SECTION  1993  . CHOLECYSTECTOMY  2011    Current Outpatient Prescriptions  Medication Sig Dispense Refill  . Ascorbic Acid (  VITAMIN C PO) Take by mouth.    . ASTAXANTHIN PO Take 1 tablet by mouth every morning.    . drospirenone-ethinyl estradiol (YASMIN,ZARAH,SYEDA) 3-0.03 MG tablet Take 1 tablet by mouth daily. 1 Package 2  . DULoxetine (CYMBALTA) 30 MG capsule Take 1 capsule (30 mg total) by mouth daily. 30 capsule 0  . fluticasone (FLONASE) 50 MCG/ACT nasal spray by Nasal route once daily.    Marland Kitchen KRILL OIL PO Take by mouth. 3 by mouth daily    . lactobacillus acidophilus (BACID) TABS tablet Take 2 tablets by mouth daily.    Marland Kitchen levothyroxine (SYNTHROID, LEVOTHROID) 50 MCG tablet Take 1 tablet (50 mcg total) by mouth daily. 90 tablet 0  . Nutritional Supplements  (JOINT FORMULA PO) Take 1 tablet by mouth every morning.    . ondansetron (ZOFRAN) 4 MG tablet Take 4 mg by mouth every 8 (eight) hours as needed for nausea or vomiting.    . RABEprazole (ACIPHEX) 20 MG tablet 1 by mouth daily 90 tablet 3  . UNABLE TO FIND 1 capsule once daily. Med Name: Dr. Anice Paganini Joint Formula    . UNABLE TO FIND Fumarate iron takes 2 a day    . zolpidem (AMBIEN) 10 MG tablet Take 1 tablet (10 mg total) by mouth at bedtime as needed for sleep. 30 tablet 0   No current facility-administered medications for this visit.      ALLERGIES: Augmentin [amoxicillin-pot clavulanate]; Amoxicillin-pot clavulanate; and Sulfamethoxazole-trimethoprim  Family History  Problem Relation Age of Onset  . Diabetes Father   . Hypertension Father   . Diabetes Mother   . Hypertension Mother   . Thyroid disease Mother     Graves Disease  . Heart attack Maternal Grandfather   . Heart attack Paternal Grandfather   . Coronary artery disease Paternal Grandfather   . Breast cancer Paternal Grandmother   . Diabetes Maternal Grandmother   . Hypertension Maternal Grandmother     Social History   Social History  . Marital status: Married    Spouse name: N/A  . Number of children: N/A  . Years of education: N/A   Occupational History  . Not on file.   Social History Main Topics  . Smoking status: Never Smoker  . Smokeless tobacco: Never Used  . Alcohol use 3.6 oz/week    6 Standard drinks or equivalent per week  . Drug use:     Frequency: 7.0 times per week    Types: Marijuana     Comment: Ingests nightly to help with sleep  . Sexual activity: Yes    Partners: Male    Birth control/ protection: None, Pill   Other Topics Concern  . Not on file   Social History Narrative  . No narrative on file    ROS:  Pertinent items are noted in HPI.  PHYSICAL EXAMINATION:    BP (!) 150/82 (BP Location: Right Arm, Patient Position: Sitting, Cuff Size: Normal)   Pulse 88   Ht 5\' 2"   (1.575 m)   Wt 137 lb 3.2 oz (62.2 kg)   LMP 08/12/2016 (Exact Date)   BMI 25.09 kg/m     General appearance: alert, cooperative and appears stated age.  Somewhat tearful with anxiety. Lungs: clear to auscultation bilaterally Heart: regular rate and rhythm Abdomen: soft, non-tender, no masses,  no organomegaly   Pelvic: Deferred.  ASSESSMENT  Right ovarian endometriomas.  Elevated blood pressure.  On placebo pills of Yasmin.  Malaise.  Anemia.  Chart review  shows Hgb 12.0 on 07/06/16 at [facility]. Recent initiation of Cymbalta for anxiety. I do not think this is serotonin syndrome as her symptoms preceded this. Hypothyroidism.  Recently stopped Synthroid due to current malaise.  PLAN  Stop Yasmin.  Start Micronor.  Instructed in use.  Will get a copy of her labs from her visit to [facility].  We discussed surgery for her endometriosis.  A comprehensive approach will be to do a laparoscopy with possible total laparoscopic hysterectomy and bilateral salpingo-oophorectomy versus total abdominal hysterectomy with bilateral salpingo-oophorectomy.  (We will also need to collect pelvic washings and do cystoscopy.)  I reviewed  Risks, benefits, and alternatives.  Risks include but are not limited to bleeding, infection, damage to surrounding organs, pneumonia, reaction to anesthesia, DVT, PE, death, need for reoperation, need to convert to a traditional Pfannenstiel or vertical laparotomy incision to complete the procedure, and menopausal symptoms if ovaries are removed.  Menopausal symptoms can be treated with hormonal and nonhormonal medication.  She understands there is a rare possibility of doing a diagnostic laparoscopy only and then referring to [place] for surgical care if she has a frozen pelvis. She may also see GYN ONC directly for her surgical intervention. She wishes to proceed with scheduling surgery with me at this time. She will do a magnesium citrate bowel prep the day  prior to surgery. We will assist her in scheduling her mammogram to get that updated.  An After Visit Summary was printed and given to the patient.  __40____ minutes face to face time of which over 50% was spent in counseling.

## 2016-08-13 NOTE — Progress Notes (Signed)
Patient scheduled for screening mammogram at [facility] on 08/18/2016 at 4:30 pm. Patient is agreeable to date and time.

## 2016-08-13 NOTE — Telephone Encounter (Signed)
Return telephone call to pt. Pt confirmed she has never used Lexapro or Lamictal in the past. Prescription will need to be sent to [facility].

## 2016-08-13 NOTE — Telephone Encounter (Signed)
Patient says her prescription for the progesterone birth control was [facility] when she wanted [facility] to [facility] [address] at [phone].

## 2016-08-13 NOTE — Telephone Encounter (Signed)
Need to know what other meds has she used. I have in list zoloft. Has she used lexapro before? Or and lamictal

## 2016-08-14 NOTE — Telephone Encounter (Signed)
Patient can get start on lexapro 5mg . Will keep dose low as she has side effects from other meds. Till I see her

## 2016-08-17 ENCOUNTER — Encounter: Payer: Self-pay | Admitting: Obstetrics and Gynecology

## 2016-08-17 ENCOUNTER — Telehealth: Payer: Self-pay | Admitting: Obstetrics and Gynecology

## 2016-08-17 NOTE — Telephone Encounter (Signed)
Return call to patient. Left message to call back. Surgery is scheduled for 09-07-16.

## 2016-08-17 NOTE — Telephone Encounter (Signed)
Patient calling to speak with the surgery scheduler.

## 2016-08-17 NOTE — Telephone Encounter (Signed)
Call from patient. Advised surgery is scheduled for 09-07-16 at 40 at [facility] to plan arrival for 0600 unless hospital instructs differently. Surgery instruction sheet reviewed and printed copy will be mailed to patient, see copy scanned to chart. Patient states she has PCP appointment on 08-24-16. She has faxed lab results from urgent care including thyroid levels. She discontinued her synthroid medication (see office visit notes from Dr Quincy Simmonds) but now feels her hands and feet and cold and clammy again and she needs instructions regarding staying off medication or restarting.  Patient is aware Dr Quincy Simmonds is out of office and labs will be reviewed by Dr Talbert Nan.   Labs to your office.

## 2016-08-17 NOTE — Telephone Encounter (Signed)
Please let the patient know that her current TFT's are normal, but this is not representative of her real levels since she just stopped her synthroid. She needs to contact her primary MD. She likely needs to restart her synthroid, but she should discuss this with her primary. I wouldn't wait until next week when she has an appointment.

## 2016-08-17 NOTE — Telephone Encounter (Signed)
Call to patient. Advised of recommendation from Dr Talbert Nan to follow up with PCP, prefer sooner than next week's scheduled appointment so that can optimize thyroid function prior to surgery. Patient is agreeable that may need to restart since she is uncomfortable with cold and clammy hands and feet. She is agreeable to call PCP tomorrow.   Encounter closed.

## 2016-08-17 NOTE — Telephone Encounter (Signed)
Pt called to informed Dr. De Nurse, she stopped Cymbalta, and Lexapro. Pt states she will be stopping all medications now. Nothing further is need at this time.

## 2016-08-18 ENCOUNTER — Telehealth: Payer: Self-pay | Admitting: Obstetrics and Gynecology

## 2016-08-18 NOTE — Telephone Encounter (Signed)
Called patient to review benefits for a recommended surgical procedure. Left Voicemail requesting a call back. °

## 2016-08-18 NOTE — Telephone Encounter (Signed)
Spoke with patient regarding benefits for outpatient surgical procedure. Patient understood and is agreeable. Patient provided prepayment, see account notes for more information. Patient's surgery date has already been established for 09/07/16.

## 2016-08-19 ENCOUNTER — Telehealth: Payer: Self-pay | Admitting: *Deleted

## 2016-08-19 NOTE — Telephone Encounter (Signed)
Forwarding FYI  Cc: Dr. Enid Derry  To: Hurman Horn Nordmann    From: Burnice Logan, RN    Created: 08/19/2016 11:49 AM     Mrs. Shutt,  Good morning. I am assisting with MyChart messages while Dr. Quincy Simmonds is out of the office. I will forward for her review when she returns on 08/31/16. Please call the office for any questions or concerns. Have a great day!  Sincerely,  Glorianne Manchester, RN    ----- Message -----  From: Hurman Horn. Malone  Sent: 08/17/2016 11:36 AM EST  To: Arloa Koh, MD Subject: Visit Follow-Up Question  Hi Dr. Quincy Simmonds - Just a quick update from my visit last week.  1.I think you were right on about the Cymbalta reaction.Weird how these symptoms were mimicking each other.After I left your office, I could not sit still or concentrate.I was driving in the car and caught myself thinking about running it into a tree.So - called Dr. De Nurse, the psychiatrist I saw, told him and they said to stop immediately.Donnald Garre been sick with all those symptoms all weekend, but I believe I have turned the corner today, thankfully!He did call in something else, but I have decided to skip this route, as physically I just need some strength and these seem to zap that, and I need a clear head.Better 4 days of hormonal crying and anxiety than 2 weeks with withdrawal.   2.I am set up to see Dr. Aris Lot at [facility] to start physically preparing myself for surgery.If you need me to discuss anything specifically with him, please let me know.  Hope this helps, waiting for my date.=)

## 2016-08-20 ENCOUNTER — Ambulatory Visit (HOSPITAL_COMMUNITY): Payer: Self-pay | Admitting: Psychiatry

## 2016-08-21 ENCOUNTER — Other Ambulatory Visit: Payer: 59

## 2016-08-24 NOTE — Telephone Encounter (Signed)
Patient is returning a call to Suzy. °

## 2016-08-25 NOTE — Patient Instructions (Signed)
Your procedure is scheduled on:  Monday, Dec. 4, 2017  Enter through the [employer] of [facility] at:  6:00 AM  Pick up the phone at the desk and dial [phone].  Call this number if you have problems the morning of surgery: [phone].  Remember: Do NOT eat food or drink after:  Midnight Sunday, Dec. 3, 2017  Take these medicines the morning of surgery with a SIP OF WATER:  Aciphex  Stop ALL herbal medications and Vitamin C at this time   Do NOT wear jewelry (body piercing), metal hair clips/bobby pins, make-up, or nail polish. Do NOT wear lotions, powders, or perfumes.  You may wear deodorant. Do NOT shave for 48 hours prior to surgery. Do NOT bring valuables to the hospital. Contacts, dentures, or bridgework may not be worn into surgery.  Leave suitcase in car.  After surgery it may be brought to your room.  For patients admitted to the hospital, checkout time is 11:00 AM the day of discharge.  Have a responsible adult drive you home and stay with you for 24 hours after your procedureYour procedure is scheduled on:

## 2016-08-26 ENCOUNTER — Encounter (HOSPITAL_COMMUNITY): Payer: Self-pay

## 2016-08-26 ENCOUNTER — Encounter (HOSPITAL_COMMUNITY)
Admission: RE | Admit: 2016-08-26 | Discharge: 2016-08-26 | Disposition: A | Discharge status: Home / Self Care | Payer: 59 | Source: Ambulatory Visit | Attending: Obstetrics and Gynecology | Admitting: Obstetrics and Gynecology

## 2016-08-26 DIAGNOSIS — N83201 Unspecified ovarian cyst, right side: Secondary | ICD-10-CM | POA: Insufficient documentation

## 2016-08-26 DIAGNOSIS — Z01812 Encounter for preprocedural laboratory examination: Secondary | ICD-10-CM | POA: Diagnosis not present

## 2016-08-26 DIAGNOSIS — Z0183 Encounter for blood typing: Secondary | ICD-10-CM | POA: Diagnosis not present

## 2016-08-26 HISTORY — DX: Prediabetes: R73.03

## 2016-08-26 HISTORY — DX: Pneumonia, unspecified organism: J18.9

## 2016-08-26 HISTORY — DX: Nausea with vomiting, unspecified: R11.2

## 2016-08-26 HISTORY — DX: Other specified postprocedural states: Z98.890

## 2016-08-26 LAB — BASIC METABOLIC PANEL
ANION GAP: 5 (ref 5–15)
BUN: 22 mg/dL — ABNORMAL HIGH (ref 6–20)
CO2: 26 mmol/L (ref 22–32)
Calcium: 9.4 mg/dL (ref 8.9–10.3)
Chloride: 107 mmol/L (ref 101–111)
Creatinine, Ser: 0.76 mg/dL (ref 0.44–1.00)
GFR calc Af Amer: >60 mL/min (ref >60)
Glucose, Bld: 104 mg/dL — ABNORMAL HIGH (ref 65–99)
POTASSIUM: 4.2 mmol/L (ref 3.5–5.1)
SODIUM: 138 mmol/L (ref 135–145)

## 2016-08-26 LAB — ABO/RH: ABO/RH(D): B POS

## 2016-08-26 LAB — TYPE AND SCREEN
ABO/RH(D): B POS
Antibody Screen: NEGATIVE

## 2016-08-31 ENCOUNTER — Encounter: Payer: Self-pay | Admitting: Obstetrics and Gynecology

## 2016-08-31 ENCOUNTER — Ambulatory Visit (INDEPENDENT_AMBULATORY_CARE_PROVIDER_SITE_OTHER): Payer: 59 | Admitting: Obstetrics and Gynecology

## 2016-08-31 VITALS — BP 126/70 | HR 88 | Resp 16 | Ht 62.0 in | Wt 136.0 lb

## 2016-08-31 DIAGNOSIS — N83201 Unspecified ovarian cyst, right side: Secondary | ICD-10-CM

## 2016-08-31 NOTE — Progress Notes (Signed)
GYNECOLOGY  VISIT   HPI: 50 y.o.   Married  Caucasian  female   G2P1011 with Patient's last menstrual period was 08/12/2016 (exact date).   here for surgical consult for right ovarian cysts. Patient interested in hysterectomy and removal of endometriosis.  Declines future childbearing.   Husband is present for the entire visit today.  On Camilla. No bleeding or pain currently.  No significant pain with her menses.  Has vomiting with her menses.   Has an enlarged right ovary with cysts consistent with endometriosis.  Last ultrasound 06/25/16: Uterus - retroverted and no myometrial masses. EMS 4.18 mm. Right ovary - 8.82 x 4.48 cm. 3 cysts: 5.2, 3.03, and 1.5 cm. Cystic debris noted. Consistent with endometriomas. Appearance unchanged from previous scan. Left ovary normal.  Both ovaries adherent to uterus.  No free fluid.  She did have a temporary rise in CA125 to a level of 87 on 01/16/16 and had consultation with GYN ONC who recommended close follow up. She subsequently had a decline in the CA125 down to 29 on 04/16/16.  Saw Dr. Aris Lot at [facility] and had medical clearance for surgery.  Off Cymbalta and feeling better. 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  Is now off the Synthroid as well.  Stopped this and had thyroid rechecked and it was normal on 08/24/16. She had shakiness on this medication, and this is gone.  GYNECOLOGIC HISTORY: Patient's last menstrual period was 08/12/2016 (exact date). Contraception:  OCPs--Micronor. Menopausal hormone therapy:  n/a Last mammogram:  2015 Lt.Diag.Recall--Density C/benign calcifications upper-outer quadrant/screening 1 yr/BiRads2:[facility].  Had mammogram at [facility] and states this was normal.  Last pap smear: 09-12-15 Neg:Neg HR HPV          OB History    Gravida Para Term Preterm AB Living   2 1 1   1 1    SAB TAB Ectopic Multiple Live Births     1               Patient Active Problem List   Diagnosis Date Noted   . Elevated hemoglobin A1c 06/25/2016  . Anemia, iron deficiency 06/25/2016  . Anxiety and depression 03/26/2016  . Abdominal pain 03/26/2016  . Right ovarian cyst.  Probably endometriomas.  Do yearly ultrasound. 02/07/2016  . Elevated CA-125 02/07/2016  . MOTION SICKNESS 10/07/2010  . NEVUS 11/07/2008  . ALLERGIC RHINITIS 11/07/2008  . CERVICAL RADICULOPATHY, LEFT 12/01/2007  . HYPOTHYROIDISM 11/28/2007  . HYPERLIPIDEMIA 11/28/2007  . GERD 11/28/2007  . OTHER ABNORMAL BLOOD CHEMISTRY 11/28/2007    Past Medical History:  Diagnosis Date  . Anemia   . Anxiety   . Depression   . Elevated hemoglobin A1c   . GERD (gastroesophageal reflux disease)   . Heart murmur   . Hyperlipemia    NMR 2008, LDL 123(2539/1568) HDL 55, TG 179  . Hypothyroidism    hypothyroidism, has regulated back to normal no longer taking medication  . Infertility, female   . Iron deficiency anemia   . Pneumonia    history of   . PONV (postoperative nausea and vomiting)   . Pre-diabetes   . Right ovarian cyst    probable endometriomas.  Do yearly ultrasound.    Past Surgical History:  Procedure Laterality Date  . CESAREAN SECTION  1993  . CHOLECYSTECTOMY  2011  . MOUTH SURGERY    . WISDOM TOOTH EXTRACTION      Current Outpatient Prescriptions  Medication Sig Dispense Refill  . FERROUS  FUMARATE-VITAMIN C PO Take 2 tablets by mouth daily.    . fluticasone (FLONASE) 50 MCG/ACT nasal spray Place 1 spray into both nostrils every evening.    Marland Kitchen KRILL OIL PO Take 3 tablets by mouth every morning.     . lactobacillus acidophilus (BACID) TABS tablet Take 2 tablets by mouth daily.    . norethindrone (MICRONOR,CAMILA,ERRIN) 0.35 MG tablet Take 1 tablet (0.35 mg total) by mouth daily. 1 Package 2  . RABEprazole (ACIPHEX) 20 MG tablet 1 by mouth daily 90 tablet 3  . UNABLE TO FIND 1 capsule once daily. Med Name: Dr. Anice Paganini Joint Formula     No current facility-administered medications for this visit.       ALLERGIES: Augmentin [amoxicillin-pot clavulanate]; Amoxicillin-pot clavulanate; Sulfamethoxazole-trimethoprim; Cymbalta [duloxetine hcl]; Tape; and Amoxicillin  Family History  Problem Relation Age of Onset  . Diabetes Father   . Hypertension Father   . Diabetes Mother   . Hypertension Mother   . Thyroid disease Mother     Graves Disease  . Heart attack Maternal Grandfather   . Heart attack Paternal Grandfather   . Coronary artery disease Paternal Grandfather   . Breast cancer Paternal Grandmother   . Diabetes Maternal Grandmother   . Hypertension Maternal Grandmother     Social History   Social History  . Marital status: Married    Spouse name: N/A  . Number of children: N/A  . Years of education: N/A   Occupational History  . Not on file.   Social History Main Topics  . Smoking status: Never Smoker  . Smokeless tobacco: Never Used  . Alcohol use 3.6 oz/week    6 Standard drinks or equivalent per week  . Drug use:     Frequency: 7.0 times per week    Types: Marijuana     Comment: Ingests nightly to help with sleep  . Sexual activity: Yes    Partners: Male    Birth control/ protection: None, Pill   Other Topics Concern  . Not on file   Social History Narrative  . No narrative on file    ROS:  Pertinent items are noted in HPI.  PHYSICAL EXAMINATION:    BP 126/70 (BP Location: Right Arm, Patient Position: Sitting, Cuff Size: Normal)   Pulse 88   Resp 16   Ht 5\' 2"  (1.575 m)   Wt 136 lb (61.7 kg)   LMP 08/12/2016 (Exact Date)   BMI 24.87 kg/m     General appearance: alert, cooperative and appears stated age Head: Normocephalic, without obvious abnormality, atraumatic Neck: no adenopathy, supple, symmetrical, trachea midline and thyroid normal to inspection and palpation Lungs: clear to auscultation bilaterally Heart: regular rate and rhythm Abdomen: soft, non-tender, no masses,  no organomegaly Extremities: extremities normal, atraumatic, no  cyanosis or edema Skin: Skin color, texture, turgor normal. No rashes or lesions No abnormal inguinal nodes palpated Neurologic: Grossly normal  Pelvic: External genitalia:  no lesions              Urethra:  normal appearing urethra with no masses, tenderness or lesions              Bartholins and Skenes: normal                 Vagina: normal appearing vagina with normal color and discharge, no lesions              Cervix: no lesions  Bimanual Exam:  Uterus:  normal size, contour, position, consistency, mobility, non-tender              Adnexa: large mass fills posterior cul de sac and feels fixed. Nontender.               Rectal exam: declined.  Chaperone was present for exam.  ASSESSMENT  Right ovarian endometriomas.  Mildly elevated CA125 resolved back to normal.  Hx Cesarean Section.  On Micronor.   PLAN   We discussed surgery for her endometriosis.  A comprehensive approach will be to do a laparoscopy with total laparoscopic hysterectomy and bilateral salpingo-oophorectomy versus total abdominal hysterectomy with bilateral salpingo-oophorectomy.  We will also need to collect pelvic washings and do cystoscopy. I reviewed  Risks, benefits, and alternatives.  Risks include but are not limited to bleeding, infection, damage to surrounding organs, pneumonia, reaction to anesthesia, DVT, PE, death, need for reoperation, need to convert to a traditional Pfannenstiel or vertical laparotomy incision to complete the procedure, and menopausal symptoms if ovaries are removed.  Menopausal symptoms can be treated with hormonal and nonhormonal medication.  She understands there is a rare possibility of doing a diagnostic laparoscopy only and then referring to [facility] for surgical care if she has a frozen pelvis. She will do a magnesium citrate bowel prep the day prior to surgery.   An After Visit Summary was printed and given to the patient.

## 2016-09-06 NOTE — H&P (Signed)
Office Visit   08/31/2016 [place], MD  Obstetrics and Gynecology   Right ovarian cyst  Dx   Surgical Consult ; Referred by Vickey Huger, MD  Reason for Visit   Additional Documentation   Vitals:   BP 126/70 (BP Location: Right Arm, Patient Position: Sitting, Cuff Size: Normal)   Pulse 88   Resp 16   Ht 5\' 2"  (1.575 m)   Wt 136 lb (61.7 kg)   LMP 08/12/2016 (Exact Date)   BMI 24.87 kg/m   BSA 1.64 m   Flowsheets:   Infectious Disease Screening,   Custom Formula Data,   MEWS Score,   Anthropometrics     Encounter Info:   Billing Info,   History,   Allergies,   Detailed Report     All Notes   Progress Notes by Nunzio Cobbs, MD at 08/31/2016 10:30 AM   Author: Nunzio Cobbs, MD Author Type: Physician Filed: 09/02/2016 9:39 PM  Note Status: Signed Cosign: Cosign Not Required Encounter Date: 08/31/2016 10:30 AM  Editor: Nunzio Cobbs, MD (Physician)  Prior Versions: 1. Archie Balboa, CMA (Certified Medical Assistant) at 08/31/2016 10:40 AM - Sign at close encounter   2. Lowella Fairy, CMA (Certified Medical Assistant) at 08/31/2016 10:22 AM - Sign at close encounter    GYNECOLOGY  VISIT   HPI: 50 y.o.   Married  Caucasian  female   G2P1011 with Patient's last menstrual period was 08/12/2016 (exact date).   here for surgical consult for right ovarian cysts. Patient interested in hysterectomy and removal of endometriosis.  Declines future childbearing.   Husband is present for the entire visit today.  On Camilla. No bleeding or pain currently.  No significant pain with her menses.  Has vomiting with her menses.   Has an enlarged right ovary with cysts consistent with endometriosis.  Last ultrasound 06/25/16: Uterus - retroverted and no myometrial masses. EMS 4.18 mm. Right ovary - 8.82 x 4.48 cm. 3 cysts: 5.2, 3.03, and 1.5 cm. Cystic debris noted. Consistent with  endometriomas. Appearance unchanged from previous scan. Left ovary normal.  Both ovaries adherent to uterus.  No free fluid.  She did have a temporary rise in CA125 to a level of 87 on 01/16/16 and had consultation with GYN ONC who recommended close follow up. She subsequently had a decline in the CA125 down to 29 on 04/16/16.  Saw Dr. Aris Lot at [facility] and had medical clearance for surgery.  Off Cymbalta and feeling better. 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  Is now off the Synthroid as well.  Stopped this and had thyroid rechecked and it was normal on 08/24/16. She had shakiness on this medication, and this is gone.  GYNECOLOGIC HISTORY: Patient's last menstrual period was 08/12/2016 (exact date). Contraception:  OCPs--Micronor. Menopausal hormone therapy:  n/a Last mammogram: 2015 Lt.Diag.Recall--Density C/benign calcifications upper-outer quadrant/screening 1 yr/[facility].  Had mammogram at [facility] and states this was normal.  Last pap smear: 09-12-15 Neg:Neg HR HPV                  OB History    Gravida Para Term Preterm AB Living   2 1 1   1 1    SAB TAB Ectopic Multiple Live Births     1  Patient Active Problem List   Diagnosis Date Noted  . Elevated hemoglobin A1c 06/25/2016  . Anemia, iron deficiency 06/25/2016  . Anxiety and depression 03/26/2016  . Abdominal pain 03/26/2016  . Right ovarian cyst.  Probably endometriomas.  Do yearly ultrasound. 02/07/2016  . Elevated CA-125 02/07/2016  . MOTION SICKNESS 10/07/2010  . NEVUS 11/07/2008  . ALLERGIC RHINITIS 11/07/2008  . CERVICAL RADICULOPATHY, LEFT 12/01/2007  . HYPOTHYROIDISM 11/28/2007  . HYPERLIPIDEMIA 11/28/2007  . GERD 11/28/2007  . OTHER ABNORMAL BLOOD CHEMISTRY 11/28/2007        Past Medical History:  Diagnosis Date  . Anemia   . Anxiety   . Depression   . Elevated hemoglobin A1c   . GERD (gastroesophageal reflux disease)   . Heart murmur    . Hyperlipemia    NMR 2008, LDL 123(2539/1568) HDL 55, TG 179  . Hypothyroidism    hypothyroidism, has regulated back to normal no longer taking medication  . Infertility, female   . Iron deficiency anemia   . Pneumonia    history of   . PONV (postoperative nausea and vomiting)   . Pre-diabetes   . Right ovarian cyst    probable endometriomas.  Do yearly ultrasound.         Past Surgical History:  Procedure Laterality Date  . CESAREAN SECTION  1993  . CHOLECYSTECTOMY  2011  . MOUTH SURGERY    . WISDOM TOOTH EXTRACTION            Current Outpatient Prescriptions  Medication Sig Dispense Refill  . FERROUS FUMARATE-VITAMIN C PO Take 2 tablets by mouth daily.    . fluticasone (FLONASE) 50 MCG/ACT nasal spray Place 1 spray into both nostrils every evening.    Marland Kitchen KRILL OIL PO Take 3 tablets by mouth every morning.     . lactobacillus acidophilus (BACID) TABS tablet Take 2 tablets by mouth daily.    . norethindrone (MICRONOR,CAMILA,ERRIN) 0.35 MG tablet Take 1 tablet (0.35 mg total) by mouth daily. 1 Package 2  . RABEprazole (ACIPHEX) 20 MG tablet 1 by mouth daily 90 tablet 3  . UNABLE TO FIND 1 capsule once daily. Med Name: Dr. Anice Paganini Joint Formula     No current facility-administered medications for this visit.      ALLERGIES: Augmentin [amoxicillin-pot clavulanate]; Amoxicillin-pot clavulanate; Sulfamethoxazole-trimethoprim; Cymbalta [duloxetine hcl]; Tape; and Amoxicillin        Family History  Problem Relation Age of Onset  . Diabetes Father   . Hypertension Father   . Diabetes Mother   . Hypertension Mother   . Thyroid disease Mother     Graves Disease  . Heart attack Maternal Grandfather   . Heart attack Paternal Grandfather   . Coronary artery disease Paternal Grandfather   . Breast cancer Paternal Grandmother   . Diabetes Maternal Grandmother   . Hypertension Maternal Grandmother     Social History         Social History  . Marital status: Married    Spouse name: N/A  . Number of children: N/A  . Years of education: N/A      Occupational History  . Not on file.         Social History Main Topics  . Smoking status: Never Smoker  . Smokeless tobacco: Never Used  . Alcohol use 3.6 oz/week    6 Standard drinks or equivalent per week  . Drug use:     Frequency: 7.0 times per week    Types: Marijuana  Comment: Ingests nightly to help with sleep  . Sexual activity: Yes    Partners: Male    Birth control/ protection: None, Pill       Other Topics Concern  . Not on file      Social History Narrative  . No narrative on file    ROS:  Pertinent items are noted in HPI.  PHYSICAL EXAMINATION:    BP 126/70 (BP Location: Right Arm, Patient Position: Sitting, Cuff Size: Normal)   Pulse 88   Resp 16   Ht 5\' 2"  (1.575 m)   Wt 136 lb (61.7 kg)   LMP 08/12/2016 (Exact Date)   BMI 24.87 kg/m     General appearance: alert, cooperative and appears stated age Head: Normocephalic, without obvious abnormality, atraumatic Neck: no adenopathy, supple, symmetrical, trachea midline and thyroid normal to inspection and palpation Lungs: clear to auscultation bilaterally Heart: regular rate and rhythm Abdomen: soft, non-tender, no masses,  no organomegaly Extremities: extremities normal, atraumatic, no cyanosis or edema Skin: Skin color, texture, turgor normal. No rashes or lesions No abnormal inguinal nodes palpated Neurologic: Grossly normal  Pelvic: External genitalia:  no lesions              Urethra:  normal appearing urethra with no masses, tenderness or lesions              Bartholins and Skenes: normal                 Vagina: normal appearing vagina with normal color and discharge, no lesions              Cervix: no lesions                Bimanual Exam:  Uterus:  normal size, contour, position, consistency, mobility, non-tender              Adnexa:  large mass fills posterior cul de sac and feels fixed. Nontender.               Rectal exam: declined.  Chaperone was present for exam.  ASSESSMENT  Right ovarian endometriomas.  Mildly elevated CA125 resolved back to normal.  Hx Cesarean Section.  On Micronor.   PLAN   We discussed surgery for her endometriosis.  A comprehensive approach will be to do a laparoscopy with total laparoscopic hysterectomy and bilateral salpingo-oophorectomy versus total abdominal hysterectomy with bilateral salpingo-oophorectomy.  We will also need to collect pelvic washings and do cystoscopy. I reviewed Risks, benefits, and alternatives. Risks include but are not limited to bleeding, infection, damage to surrounding organs, pneumonia, reaction to anesthesia, DVT, PE, death, need for reoperation, need to convert to a traditional Pfannenstiel or vertical laparotomy incision to complete the procedure, and menopausal symptoms if ovaries are removed. Menopausal symptoms can be treated with hormonal and nonhormonal medication.  She understands there is a rare possibility of doing a diagnostic laparoscopy only and then referring to [facility] for surgical care if she has a frozen pelvis. She will do a magnesium citrate bowel prep the day prior to surgery.   An After Visit Summary was printed and given to the patient.

## 2016-09-07 ENCOUNTER — Encounter (HOSPITAL_COMMUNITY)
Admission: RE | Disposition: A | Discharge status: Home / Self Care | Payer: Self-pay | Source: Ambulatory Visit | Attending: Obstetrics and Gynecology

## 2016-09-07 ENCOUNTER — Other Ambulatory Visit: Payer: Self-pay | Admitting: Obstetrics and Gynecology

## 2016-09-07 ENCOUNTER — Ambulatory Visit (HOSPITAL_COMMUNITY): Payer: 59 | Admitting: Anesthesiology

## 2016-09-07 ENCOUNTER — Ambulatory Visit (HOSPITAL_COMMUNITY)
Admission: RE | Admit: 2016-09-07 | Discharge: 2016-09-07 | Disposition: A | Discharge status: Home / Self Care | Payer: 59 | Source: Ambulatory Visit | Attending: Obstetrics and Gynecology | Admitting: Obstetrics and Gynecology

## 2016-09-07 ENCOUNTER — Encounter (HOSPITAL_COMMUNITY): Payer: Self-pay | Admitting: *Deleted

## 2016-09-07 DIAGNOSIS — Z888 Allergy status to other drugs, medicaments and biological substances status: Secondary | ICD-10-CM | POA: Insufficient documentation

## 2016-09-07 DIAGNOSIS — N7011 Chronic salpingitis: Secondary | ICD-10-CM | POA: Insufficient documentation

## 2016-09-07 DIAGNOSIS — F329 Major depressive disorder, single episode, unspecified: Secondary | ICD-10-CM | POA: Diagnosis not present

## 2016-09-07 DIAGNOSIS — N736 Female pelvic peritoneal adhesions (postinfective): Secondary | ICD-10-CM | POA: Diagnosis not present

## 2016-09-07 DIAGNOSIS — N83201 Unspecified ovarian cyst, right side: Secondary | ICD-10-CM | POA: Insufficient documentation

## 2016-09-07 DIAGNOSIS — E785 Hyperlipidemia, unspecified: Secondary | ICD-10-CM | POA: Diagnosis not present

## 2016-09-07 DIAGNOSIS — Z880 Allergy status to penicillin: Secondary | ICD-10-CM | POA: Insufficient documentation

## 2016-09-07 DIAGNOSIS — M5412 Radiculopathy, cervical region: Secondary | ICD-10-CM | POA: Diagnosis not present

## 2016-09-07 DIAGNOSIS — K219 Gastro-esophageal reflux disease without esophagitis: Secondary | ICD-10-CM | POA: Diagnosis not present

## 2016-09-07 DIAGNOSIS — N803 Endometriosis of pelvic peritoneum: Secondary | ICD-10-CM | POA: Insufficient documentation

## 2016-09-07 DIAGNOSIS — D649 Anemia, unspecified: Secondary | ICD-10-CM | POA: Insufficient documentation

## 2016-09-07 DIAGNOSIS — N809 Endometriosis, unspecified: Secondary | ICD-10-CM | POA: Diagnosis not present

## 2016-09-07 DIAGNOSIS — E039 Hypothyroidism, unspecified: Secondary | ICD-10-CM | POA: Diagnosis not present

## 2016-09-07 DIAGNOSIS — N801 Endometriosis of ovary: Secondary | ICD-10-CM | POA: Diagnosis not present

## 2016-09-07 DIAGNOSIS — F419 Anxiety disorder, unspecified: Secondary | ICD-10-CM | POA: Insufficient documentation

## 2016-09-07 DIAGNOSIS — Z882 Allergy status to sulfonamides status: Secondary | ICD-10-CM | POA: Insufficient documentation

## 2016-09-07 HISTORY — PX: LAPAROSCOPY: SHX197

## 2016-09-07 HISTORY — PX: LAPAROSCOPIC LYSIS OF ADHESIONS: SHX5905

## 2016-09-07 LAB — PREGNANCY, URINE: PREG TEST UR: NEGATIVE

## 2016-09-07 SURGERY — LAPAROSCOPY, DIAGNOSTIC
Anesthesia: General

## 2016-09-07 MED ORDER — SUGAMMADEX SODIUM 200 MG/2ML IV SOLN
INTRAVENOUS | Status: DC | PRN
  Administered 2016-09-07: 250 mg via INTRAVENOUS

## 2016-09-07 MED ORDER — METRONIDAZOLE IN NACL 5-0.79 MG/ML-% IV SOLN
500.0000 mg | INTRAVENOUS | Status: AC
  Administered 2016-09-07: 500 mg via INTRAVENOUS
  Filled 2016-09-07: qty 100

## 2016-09-07 MED ORDER — ROCURONIUM BROMIDE 100 MG/10ML IV SOLN
INTRAVENOUS | Status: DC | PRN
  Administered 2016-09-07: 20 mg via INTRAVENOUS
  Administered 2016-09-07: 50 mg via INTRAVENOUS

## 2016-09-07 MED ORDER — FENTANYL CITRATE (PF) 100 MCG/2ML IJ SOLN
INTRAMUSCULAR | Status: DC | PRN
  Administered 2016-09-07 (×2): 100 ug via INTRAVENOUS
  Administered 2016-09-07: 50 ug via INTRAVENOUS

## 2016-09-07 MED ORDER — METOCLOPRAMIDE HCL 5 MG/ML IJ SOLN
10.0000 mg | Freq: Once | INTRAMUSCULAR | Status: AC | PRN
  Administered 2016-09-07: 10 mg via INTRAVENOUS

## 2016-09-07 MED ORDER — SUGAMMADEX SODIUM 200 MG/2ML IV SOLN
INTRAVENOUS | Status: AC
Start: 2016-09-07 — End: 2016-09-07
  Filled 2016-09-07: qty 6

## 2016-09-07 MED ORDER — NEOSTIGMINE METHYLSULFATE 10 MG/10ML IV SOLN
INTRAVENOUS | Status: AC
  Filled 2016-09-07: qty 1

## 2016-09-07 MED ORDER — LACTATED RINGERS IV SOLN
INTRAVENOUS | Status: DC
  Administered 2016-09-07 (×2): via INTRAVENOUS

## 2016-09-07 MED ORDER — MIDAZOLAM HCL 2 MG/2ML IJ SOLN
INTRAMUSCULAR | Status: DC | PRN
  Administered 2016-09-07: 2 mg via INTRAVENOUS

## 2016-09-07 MED ORDER — ONDANSETRON HCL 4 MG/2ML IJ SOLN
INTRAMUSCULAR | Status: DC | PRN
  Administered 2016-09-07: 4 mg via INTRAVENOUS

## 2016-09-07 MED ORDER — SODIUM CHLORIDE 0.9 % IJ SOLN
INTRAMUSCULAR | Status: AC
  Filled 2016-09-07: qty 50

## 2016-09-07 MED ORDER — METOCLOPRAMIDE HCL 5 MG/ML IJ SOLN
INTRAMUSCULAR | Status: AC
  Filled 2016-09-07: qty 2

## 2016-09-07 MED ORDER — LIDOCAINE HCL (CARDIAC) 20 MG/ML IV SOLN
INTRAVENOUS | Status: DC | PRN
  Administered 2016-09-07: 80 mg via INTRAVENOUS

## 2016-09-07 MED ORDER — KETOROLAC TROMETHAMINE 30 MG/ML IJ SOLN
INTRAMUSCULAR | Status: DC | PRN
  Administered 2016-09-07: 30 mg via INTRAVENOUS

## 2016-09-07 MED ORDER — SCOPOLAMINE 1 MG/3DAYS TD PT72
MEDICATED_PATCH | TRANSDERMAL | Status: AC
  Administered 2016-09-07: 1.5 mg via TRANSDERMAL
  Filled 2016-09-07: qty 1

## 2016-09-07 MED ORDER — SCOPOLAMINE 1 MG/3DAYS TD PT72
1.0000 | MEDICATED_PATCH | Freq: Once | TRANSDERMAL | Status: DC
  Administered 2016-09-07: 1.5 mg via TRANSDERMAL

## 2016-09-07 MED ORDER — HYDROMORPHONE HCL 1 MG/ML IJ SOLN
0.2500 mg | INTRAMUSCULAR | Status: DC | PRN
  Administered 2016-09-07 (×2): 0.5 mg via INTRAVENOUS

## 2016-09-07 MED ORDER — LACTATED RINGERS IR SOLN
Status: DC | PRN
  Administered 2016-09-07: 3000 mL

## 2016-09-07 MED ORDER — PROPOFOL 10 MG/ML IV BOLUS
INTRAVENOUS | Status: DC | PRN
  Administered 2016-09-07: 180 mg via INTRAVENOUS

## 2016-09-07 MED ORDER — ROCURONIUM BROMIDE 100 MG/10ML IV SOLN
INTRAVENOUS | Status: AC
  Filled 2016-09-07: qty 1

## 2016-09-07 MED ORDER — LIDOCAINE HCL (CARDIAC) 20 MG/ML IV SOLN
INTRAVENOUS | Status: AC
  Filled 2016-09-07: qty 5

## 2016-09-07 MED ORDER — ROPIVACAINE HCL 5 MG/ML IJ SOLN
INTRAMUSCULAR | Status: AC
  Filled 2016-09-07: qty 30

## 2016-09-07 MED ORDER — GLYCOPYRROLATE 0.2 MG/ML IJ SOLN
INTRAMUSCULAR | Status: AC
Start: 2016-09-07 — End: 2016-09-07
  Filled 2016-09-07: qty 2

## 2016-09-07 MED ORDER — CIPROFLOXACIN IN D5W 400 MG/200ML IV SOLN
400.0000 mg | INTRAVENOUS | Status: AC
  Administered 2016-09-07: 400 mg via INTRAVENOUS
  Filled 2016-09-07: qty 200

## 2016-09-07 MED ORDER — BUPIVACAINE HCL (PF) 0.25 % IJ SOLN
INTRAMUSCULAR | Status: DC | PRN
  Administered 2016-09-07: 7 mL

## 2016-09-07 MED ORDER — BUPIVACAINE HCL (PF) 0.25 % IJ SOLN
INTRAMUSCULAR | Status: AC
  Filled 2016-09-07: qty 30

## 2016-09-07 MED ORDER — PROPOFOL 10 MG/ML IV BOLUS
INTRAVENOUS | Status: AC
  Filled 2016-09-07: qty 20

## 2016-09-07 MED ORDER — MEPERIDINE HCL 25 MG/ML IJ SOLN: 6.2500 mg | INTRAMUSCULAR | Status: DC | PRN

## 2016-09-07 MED ORDER — IBUPROFEN 800 MG PO TABS: 800.0000 mg | ORAL_TABLET | Freq: Three times a day (TID) | ORAL | 0 refills | Status: DC | PRN

## 2016-09-07 MED ORDER — LACTATED RINGERS IV SOLN: INTRAVENOUS | Status: DC

## 2016-09-07 MED ORDER — HYDROMORPHONE HCL 1 MG/ML IJ SOLN
INTRAMUSCULAR | Status: AC
  Administered 2016-09-07: 0.5 mg via INTRAVENOUS
  Filled 2016-09-07: qty 1

## 2016-09-07 MED ORDER — ONDANSETRON HCL 4 MG/2ML IJ SOLN
INTRAMUSCULAR | Status: AC
  Filled 2016-09-07: qty 2

## 2016-09-07 MED ORDER — HYDROMORPHONE HCL 1 MG/ML IJ SOLN
INTRAMUSCULAR | Status: AC
  Filled 2016-09-07: qty 1

## 2016-09-07 MED ORDER — HYDROMORPHONE HCL 1 MG/ML IJ SOLN
INTRAMUSCULAR | Status: DC | PRN
  Administered 2016-09-07: 1 mg via INTRAVENOUS

## 2016-09-07 MED ORDER — FENTANYL CITRATE (PF) 250 MCG/5ML IJ SOLN
INTRAMUSCULAR | Status: AC
  Filled 2016-09-07: qty 5

## 2016-09-07 MED ORDER — MIDAZOLAM HCL 2 MG/2ML IJ SOLN
INTRAMUSCULAR | Status: AC
  Filled 2016-09-07: qty 2

## 2016-09-07 MED ORDER — SODIUM CHLORIDE 0.9% FLUSH
INTRAVENOUS | Status: DC | PRN
  Administered 2016-09-07: 10 mL

## 2016-09-07 MED ORDER — DEXAMETHASONE SODIUM PHOSPHATE 4 MG/ML IJ SOLN
INTRAMUSCULAR | Status: AC
  Filled 2016-09-07: qty 1

## 2016-09-07 MED ORDER — OXYCODONE-ACETAMINOPHEN 5-325 MG PO TABS: 2.0000 | ORAL_TABLET | ORAL | 0 refills | Status: DC | PRN

## 2016-09-07 SURGICAL SUPPLY — 93 items
APPLICATOR ARISTA FLEXITIP XL (MISCELLANEOUS) IMPLANT
BARRIER ADHS 3X4 INTERCEED (GAUZE/BANDAGES/DRESSINGS) IMPLANT
BENZOIN TINCTURE PRP APPL 2/3 (GAUZE/BANDAGES/DRESSINGS) IMPLANT
CABLE HIGH FREQUENCY MONO STRZ (ELECTRODE) IMPLANT
CANISTER SUCT 3000ML (MISCELLANEOUS) ×5 IMPLANT
CATH FOLEY 3WAY  5CC 16FR (CATHETERS)
CATH FOLEY 3WAY 5CC 16FR (CATHETERS) IMPLANT
CELLS DAT CNTRL 66122 CELL SVR (MISCELLANEOUS) IMPLANT
CLOSURE WOUND 1/4 X3 (GAUZE/BANDAGES/DRESSINGS)
CLOTH BEACON ORANGE TIMEOUT ST (SAFETY) ×5 IMPLANT
DECANTER SPIKE VIAL GLASS SM (MISCELLANEOUS) ×10 IMPLANT
DERMABOND ADVANCED (GAUZE/BANDAGES/DRESSINGS)
DERMABOND ADVANCED .7 DNX12 (GAUZE/BANDAGES/DRESSINGS) IMPLANT
DRAPE WARM FLUID 44X44 (DRAPE) IMPLANT
DRSG OPSITE POSTOP 3X4 (GAUZE/BANDAGES/DRESSINGS) IMPLANT
DRSG OPSITE POSTOP 4X10 (GAUZE/BANDAGES/DRESSINGS) IMPLANT
DURAPREP 26ML APPLICATOR (WOUND CARE) ×5 IMPLANT
FILTER SMOKE EVAC LAPAROSHD (FILTER) IMPLANT
FORCEPS CUTTING 33CM 5MM (CUTTING FORCEPS) IMPLANT
GAUZE SPONGE 4X4 16PLY XRAY LF (GAUZE/BANDAGES/DRESSINGS) IMPLANT
GLOVE BIO SURGEON STRL SZ 6.5 (GLOVE) ×8 IMPLANT
GLOVE BIO SURGEONS STRL SZ 6.5 (GLOVE) ×2
GLOVE BIOGEL PI IND STRL 7.0 (GLOVE) ×9 IMPLANT
GLOVE BIOGEL PI INDICATOR 7.0 (GLOVE) ×6
GOWN STRL REUS W/TWL LRG LVL3 (GOWN DISPOSABLE) ×15 IMPLANT
HEMOSTAT ARISTA ABSORB 3G PWDR (MISCELLANEOUS) IMPLANT
LIGASURE VESSEL 5MM BLUNT TIP (ELECTROSURGICAL) ×4 IMPLANT
NEEDLE HYPO 22GX1.5 SAFETY (NEEDLE) ×5 IMPLANT
NEEDLE INSUFFLATION 120MM (ENDOMECHANICALS) ×5 IMPLANT
NS IRRIG 1000ML POUR BTL (IV SOLUTION) ×5 IMPLANT
OCCLUDER COLPOPNEUMO (BALLOONS) ×5 IMPLANT
PACK ABDOMINAL GYN (CUSTOM PROCEDURE TRAY) IMPLANT
PACK LAPAROSCOPY BASIN (CUSTOM PROCEDURE TRAY) ×5 IMPLANT
PACK TRENDGUARD 450 HYBRID PRO (MISCELLANEOUS) ×3 IMPLANT
PACK TRENDGUARD 600 HYBRD PROC (MISCELLANEOUS) IMPLANT
PACK VAGINAL MINOR WOMEN LF (CUSTOM PROCEDURE TRAY) IMPLANT
PAD OB MATERNITY 4.3X12.25 (PERSONAL CARE ITEMS) ×5 IMPLANT
POUCH LAPAROSCOPIC INSTRUMENT (MISCELLANEOUS) ×5 IMPLANT
POUCH SPECIMEN RETRIEVAL 10MM (ENDOMECHANICALS) IMPLANT
PROTECTOR NERVE ULNAR (MISCELLANEOUS) ×10 IMPLANT
RETRACTOR WND ALEXIS 25 LRG (MISCELLANEOUS) IMPLANT
RTRCTR WOUND ALEXIS 18CM MED (MISCELLANEOUS)
RTRCTR WOUND ALEXIS 25CM LRG (MISCELLANEOUS)
SCISSORS LAP 5X35 DISP (ENDOMECHANICALS) ×5 IMPLANT
SEALER TISSUE G2 CVD JAW 35 (ENDOMECHANICALS) IMPLANT
SEALER TISSUE G2 CVD JAW 45CM (ENDOMECHANICALS)
SET CYSTO W/LG BORE CLAMP LF (SET/KITS/TRAYS/PACK) IMPLANT
SET IRRIG TUBING LAPAROSCOPIC (IRRIGATION / IRRIGATOR) ×5 IMPLANT
SET TRI-LUMEN FLTR TB AIRSEAL (TUBING) IMPLANT
SHEET LAVH (DRAPES) IMPLANT
SLEEVE ADV FIXATION 5X100MM (TROCAR) ×5 IMPLANT
SLEEVE XCEL OPT CAN 5 100 (ENDOMECHANICALS) IMPLANT
SOLUTION ELECTROLUBE (MISCELLANEOUS) IMPLANT
SPONGE LAP 18X18 X RAY DECT (DISPOSABLE) IMPLANT
STAPLER VISISTAT 35W (STAPLE) IMPLANT
STRIP CLOSURE SKIN 1/4X3 (GAUZE/BANDAGES/DRESSINGS) IMPLANT
SUT PLAIN 2 0 XLH (SUTURE) IMPLANT
SUT PLAIN 3 0 FS 2 27 (SUTURE) IMPLANT
SUT VIC AB 0 CT1 18XCR BRD8 (SUTURE) IMPLANT
SUT VIC AB 0 CT1 27 (SUTURE) ×4
SUT VIC AB 0 CT1 27XBRD ANBCTR (SUTURE) ×6 IMPLANT
SUT VIC AB 0 CT1 8-18 (SUTURE)
SUT VIC AB 2-0 CT1 27 (SUTURE)
SUT VIC AB 2-0 CT1 TAPERPNT 27 (SUTURE) IMPLANT
SUT VIC AB 2-0 UR6 27 (SUTURE) IMPLANT
SUT VIC AB 4-0 PS2 27 (SUTURE) ×5 IMPLANT
SUT VICRYL 0 TIES 12 18 (SUTURE) IMPLANT
SUT VICRYL 0 UR6 27IN ABS (SUTURE) ×5 IMPLANT
SUT VICRYL 4-0 PS2 18IN ABS (SUTURE) IMPLANT
SUT VLOC 180 0 9IN  GS21 (SUTURE)
SUT VLOC 180 0 9IN GS21 (SUTURE) IMPLANT
SYR 50ML LL SCALE MARK (SYRINGE) ×5 IMPLANT
SYR CONTROL 10ML LL (SYRINGE) ×5 IMPLANT
SYRINGE 10CC LL (SYRINGE) ×5 IMPLANT
SYSTEM CARTER THOMASON II (TROCAR) ×5 IMPLANT
TIP RUMI ORANGE 6.7MMX12CM (TIP) IMPLANT
TIP UTERINE 5.1X6CM LAV DISP (MISCELLANEOUS) IMPLANT
TIP UTERINE 6.7X10CM GRN DISP (MISCELLANEOUS) IMPLANT
TIP UTERINE 6.7X6CM WHT DISP (MISCELLANEOUS) IMPLANT
TIP UTERINE 6.7X8CM BLUE DISP (MISCELLANEOUS) ×5 IMPLANT
TOWEL OR 17X24 6PK STRL BLUE (TOWEL DISPOSABLE) ×10 IMPLANT
TRAY FOLEY CATH SILVER 14FR (SET/KITS/TRAYS/PACK) ×5 IMPLANT
TRAY FOLEY CATH SILVER 16FR (SET/KITS/TRAYS/PACK) IMPLANT
TRENDGUARD 450 HYBRID PRO PACK (MISCELLANEOUS) ×5
TRENDGUARD 600 HYBRID PROC PK (MISCELLANEOUS)
TROCAR ADV FIXATION 5X100MM (TROCAR) ×5 IMPLANT
TROCAR PORT AIRSEAL 8X120 (TROCAR) IMPLANT
TROCAR XCEL DIL TIP R 11M (ENDOMECHANICALS) IMPLANT
TROCAR XCEL NON-BLD 11X100MML (ENDOMECHANICALS) ×5 IMPLANT
TROCAR XCEL NON-BLD 5MMX100MML (ENDOMECHANICALS) ×5 IMPLANT
TROCAR XCEL OPT SLVE 5M 100M (ENDOMECHANICALS) IMPLANT
WARMER LAPAROSCOPE (MISCELLANEOUS) ×5 IMPLANT
WATER STERILE IRR 1000ML POUR (IV SOLUTION) ×5 IMPLANT

## 2016-09-07 NOTE — Transfer of Care (Signed)
Immediate Anesthesia Transfer of Care Note  Patient: Lauren Mccullough  Procedure(s) Performed: Procedure(s) with comments: LAPAROSCOPY DIAGNOSTIC (N/A) - collection of pelvic washings LAPAROSCOPIC LYSIS OF ADHESIONS  Patient Location: PACU  Anesthesia Type:General  Level of Consciousness: awake, alert  and oriented  Airway & Oxygen Therapy: Patient Spontanous Breathing and Patient connected to nasal cannula oxygen  Post-op Assessment: Report given to RN and Post -op Vital signs reviewed and stable  Post vital signs: Reviewed and stable BP 146/87, HR 98, oxygen saturation 99%  Last Vitals:  Vitals:   09/07/16 0610  BP: 138/90  Pulse: 73  Resp: 18  Temp: 36.6 C    Last Pain:  Vitals:   09/07/16 0610  TempSrc: Oral      Patients Stated Pain Goal: 3 ([id number])  Complications: No apparent anesthesia complications

## 2016-09-07 NOTE — Progress Notes (Signed)
Update to History and Physical  Patient prefers to keep the left ovary if possible.   Patient examined.   OK to proceed with surgery.

## 2016-09-07 NOTE — Brief Op Note (Signed)
09/07/2016  10:00 AM  PATIENT:  Lauren Mccullough  50 y.o. female  PRE-OPERATIVE DIAGNOSIS:  right ovarian cysts, suspect endometriomas  POST-OPERATIVE DIAGNOSIS:  right ovarian cysts, pelvic and bowel adhesions, endometriosis.  PROCEDURE:  Procedure(s) with comments: LAPAROSCOPY DIAGNOSTIC (N/A) - collection of pelvic washings LAPAROSCOPIC LYSIS OF ADHESIONS - EXTENSIVE  SURGEON:  Surgeon(s) and Role:    * Codi Kertz E Yisroel Ramming, MD - Primary    * Salvadore Dom, MD  PHYSICIAN ASSISTANT: NA  ASSISTANTS: Sumner Boast, MD   ANESTHESIA:   local and general  EBL:  Total I/O In: 1200 [I.V.:1200] Out: 310 [Urine:300; Blood:10]  BLOOD ADMINISTERED:none  DRAINS: none   LOCAL MEDICATIONS USED:  MARCAINE     SPECIMEN:  Source of Specimen:  Pelvic washings.  DISPOSITION OF SPECIMEN:  PATHOLOGY  COUNTS:  YES  TOURNIQUET:  * No tourniquets in log *  DICTATION: .Other Dictation: Dictation Number    PLAN OF CARE: Discharge to home after PACU  PATIENT DISPOSITION:  PACU - hemodynamically stable.   Delay start of Pharmacological VTE agent (>24hrs) due to surgical blood loss or risk of bleeding: not applicable

## 2016-09-07 NOTE — Anesthesia Preprocedure Evaluation (Addendum)
Anesthesia Evaluation  Patient identified by MRN, date of birth, ID band Patient awake    Reviewed: Allergy & Precautions, NPO status , Patient's Chart, lab work & pertinent test results  History of Anesthesia Complications (+) PONV and history of anesthetic complications  Airway Mallampati: II  TM Distance: >3 FB Neck ROM: Full    Dental no notable dental hx. (+) Caps   Pulmonary pneumonia, resolved,    Pulmonary exam normal breath sounds clear to auscultation       Cardiovascular negative cardio ROS Normal cardiovascular exam+ Valvular Problems/Murmurs  Rhythm:Regular Rate:Normal     Neuro/Psych PSYCHIATRIC DISORDERS Anxiety Depression  Neuromuscular disease negative neurological ROS     GI/Hepatic Neg liver ROS, GERD  Medicated and Controlled,  Endo/Other  Hypothyroidism Right Ovarian Cyst  Renal/GU negative Renal ROS  negative genitourinary   Musculoskeletal negative musculoskeletal ROS (+)   Abdominal   Peds  Hematology  (+) anemia ,   Anesthesia Other Findings   Reproductive/Obstetrics Suspected endometriosis                            Anesthesia Physical Anesthesia Plan  ASA: II  Anesthesia Plan: General   Post-op Pain Management:    Induction: Intravenous and Cricoid pressure planned  Airway Management Planned: Oral ETT  Additional Equipment:   Intra-op Plan:   Post-operative Plan: Extubation in OR  Informed Consent: I have reviewed the patients History and Physical, chart, labs and discussed the procedure including the risks, benefits and alternatives for the proposed anesthesia with the patient or authorized representative who has indicated his/her understanding and acceptance.   Dental advisory given  Plan Discussed with: CRNA, Anesthesiologist and Surgeon  Anesthesia Plan Comments:         Anesthesia Quick Evaluation

## 2016-09-07 NOTE — Discharge Instructions (Signed)
DISCHARGE INSTRUCTIONS: Laparoscopy  The following instructions have been prepared to help you care for yourself upon your return home today.  Wound care:  Do not get the incision wet for the first 24 hours. The incision should be kept clean and dry.  The Band-Aids or dressings may be removed the day after surgery.  Should the incision become sore, red, and swollen after the first week, check with your doctor.  Personal hygiene:  Shower the day after your procedure.  Activity and limitations:  Do NOT drive or operate any equipment today.  Do NOT lift anything more than 15 pounds for 2-3 weeks after surgery.  Do NOT rest in bed all day.  Walking is encouraged. Walk each day, starting slowly with 5-minute walks 3 or 4 times a day. Slowly increase the length of your walks.  Walk up and down stairs slowly.  Do NOT do strenuous activities, such as golfing, playing tennis, bowling, running, biking, weight lifting, gardening, mowing, or vacuuming for 2-4 weeks. Ask your doctor when it is okay to start.  Diet: Eat a light meal as desired this evening. You may resume your usual diet tomorrow.  Return to work: This is dependent on the type of work you do. For the most part you can return to a desk job within a week of surgery. If you are more active at work, please discuss this with your doctor.  What to expect after your surgery: You may have a slight burning sensation when you urinate on the first day. You may have a very small amount of blood in the urine. Expect to have a small amount of vaginal discharge/light bleeding for 1-2 weeks. It is not unusual to have abdominal soreness and bruising for up to 2 weeks. You may be tired and need more rest for about 1 week. You may experience shoulder pain for 24-72 hours. Lying flat in bed may relieve it.  Call your doctor for any of the following:  Develop a fever of 100.4 or greater  Inability to urinate 6 hours after discharge from  hospital  Severe pain not relieved by pain medications  Persistent of heavy bleeding at incision site  Redness or swelling around incision site after a week  Increasing nausea or vomiting  Patient Signature________________________________________ Nurse Signature_________________________________________Diagnostic Laparoscopy, Care After Introduction Refer to this sheet in the next few weeks. These instructions provide you with information about caring for yourself after your procedure. Your health care provider may also give you more specific instructions. Your treatment has been planned according to current medical practices, but problems sometimes occur. Call your health care provider if you have any problems or questions after your procedure. What can I expect after the procedure? After your procedure, it is common to have mild discomfort in the throat and abdomen. Follow these instructions at home:  Take over-the-counter and prescription medicines only as told by your health care provider.  Do not drive for 24 hours if you received a sedative.  Return to your normal activities as told by your health care provider.  Do not take baths, swim, or use a hot tub until your health care provider approves. You may shower.  Follow instructions from your health care provider about how to take care of your incision. Make sure you:  Wash your hands with soap and water before you change your bandage (dressing). If soap and water are not available, use hand sanitizer.  Change your dressing as told by your health care provider.  Leave stitches (sutures), skin glue, or adhesive strips in place. These skin closures may need to stay in place for 2 weeks or longer. If adhesive strip edges start to loosen and curl up, you may trim the loose edges. Do not remove adhesive strips completely unless your health care provider tells you to do that.  Check your incision area every day for signs of infection.  Check for:  More redness, swelling, or pain.  More fluid or blood.  Warmth.  Pus or a bad smell.  It is your responsibility to get the results of your procedure. Ask your health care provider or the department performing the procedure when your results will be ready. Contact a health care provider if:  There is new pain in your shoulders.  You feel light-headed or faint.  You are unable to pass gas or unable to have a bowel movement.  You feel nauseous or you vomit.  You develop a rash.  You have more redness, swelling, or pain around your incision.  You have more fluid or blood coming from your incision.  Your incision feels warm to the touch.  You have pus or a bad smell coming from your incision.  You have a fever or chills. Get help right away if:  Your pain is getting worse.  You have ongoing vomiting.  The edges of your incision open up.  You have trouble breathing.  You have chest pain. This information is not intended to replace advice given to you by your health care provider. Make sure you discuss any questions you have with your health care provider. Document Released: 09/02/2015 Document Revised: 02/27/2016 Document Reviewed: 06/04/2015  2017 [employer]vier

## 2016-09-07 NOTE — OR Nursing (Signed)
HUSBAND GIVEN UPDATE PER DR.SILVA @ [id number]

## 2016-09-07 NOTE — Anesthesia Procedure Notes (Signed)
Procedure Name: Intubation Date/Time: 09/07/2016 7:41 AM Performed by: Elenore Paddy Pre-anesthesia Checklist: Patient identified, Emergency Drugs available, Suction available, Patient being monitored and Timeout performed Patient Re-evaluated:Patient Re-evaluated prior to inductionOxygen Delivery Method: Circle system utilized Preoxygenation: Pre-oxygenation with 100% oxygen Intubation Type: IV induction and Cricoid Pressure applied Ventilation: Mask ventilation without difficulty Laryngoscope Size: Mac and 3 Grade View: Grade I Tube type: Oral Tube size: 7.0 mm Number of attempts: 1 Airway Equipment and Method: Stylet Placement Confirmation: ETT inserted through vocal cords under direct vision,  positive ETCO2 and breath sounds checked- equal and bilateral Secured at: 20 cm Tube secured with: Tape (pink tape) Dental Injury: Teeth and Oropharynx as per pre-operative assessment

## 2016-09-07 NOTE — Anesthesia Postprocedure Evaluation (Signed)
Anesthesia Post Note  Patient: Lauren Mccullough  Procedure(s) Performed: Procedure(s) (LRB): LAPAROSCOPY DIAGNOSTIC (N/A) LAPAROSCOPIC LYSIS OF ADHESIONS  Patient location during evaluation: PACU Anesthesia Type: General Level of consciousness: awake Pain management: pain level controlled Vital Signs Assessment: post-procedure vital signs reviewed and stable Respiratory status: spontaneous breathing Cardiovascular status: stable Postop Assessment: no signs of nausea or vomiting Anesthetic complications: no     Last Vitals:  Vitals:   09/07/16 1030 09/07/16 1045  BP: 130/72 126/85  Pulse: 83 83  Resp: 20 (!) 21  Temp:      Last Pain:  Vitals:   09/07/16 0610  TempSrc: Oral   Pain Goal: Patients Stated Pain Goal: 3 (09/07/16 0610)               Yu Peggs JR,JOHN Mateo Flow

## 2016-09-08 ENCOUNTER — Encounter (HOSPITAL_COMMUNITY): Payer: Self-pay | Admitting: Obstetrics and Gynecology

## 2016-09-08 NOTE — Op Note (Signed)
NAME:  Lauren Mccullough, Lauren Mccullough NO.:  [id number]  MEDICAL RECORD NO.:  [id number]  LOCATION:                                 FACILITY:  PHYSICIAN:  Lenard Galloway, M.D.   DATE OF BIRTH:  October 18, 1965  DATE OF PROCEDURE:  09/07/2016 DATE OF DISCHARGE:                              OPERATIVE REPORT   PREOPERATIVE DIAGNOSES:  Right ovarian cysts, suspected endometriosis.  POSTOPERATIVE DIAGNOSES:  Right ovarian cysts, pelvic and bowel adhesions, endometriosis.  PROCEDURE:  Laparoscopy with collection of pelvic washings and extensive lysis of adhesions.  SURGEON:  Lenard Galloway, M.D.  ASSISTANT:  Sumner Boast, MD.  ANESTHESIA:  General endotracheal, local with 0.25% Marcaine.  IV FLUIDS:  1200 mL Ringer's lactate.  ESTIMATED BLOOD LOSS:  10 mL.  URINE OUTPUT:  300 mL.  COMPLICATIONS:  None.  INDICATIONS FOR THE PROCEDURE:  The patient is a 50 year old, gravida 2, para 1-0-1-1 Caucasian female with a long-standing history of right ovarian cysts, who presents requesting hysterectomy with removal of endometriosis.  The patient has been experiencing nausea, vomiting, and abdominal pain prior to her menstruation and experiencing ovulation pain.  She has been followed with serial pelvic ultrasounds, and her right ovary overall measures 8.8 x 4.5 cm.  The ovary is noted to contain 3 cysts with cystic debris and an appearance consistent with endometriosis on ultrasound.  The patient's left ovary has been reported to be normal, and the uterus has been unremarkable.  The adnexal regions, both were noted to be adherent to the uterus on ultrasound. The patient has been followed with CA-125 levels periodically, and she did have an elevation in this level to 87 on January 16, 2016.  This did prompt GYN/Oncology consultation, who at that time did support close followup for the patient, who declined surgical intervention at that time.  Subsequently, the CA-125 level went down to  29 on April 16, 2016. The patient continued to be symptomatic, and she did have a trial of combined oral contraceptives, and then ultimately, progesterone-only birth control.  This did control her pain and bleeding, however, she continued to have vomiting with her menstruation.  A plan is now made to proceed with a laparoscopy with a total laparoscopic hysterectomy and potential bilateral salpingo-oophorectomy, collection of pelvic washings, and cystoscopy.  The patient is also consented for a potential laparotomy for completion of the surgery.  Preoperatively, I did have discussions with the patient that if she had a frozen pelvis at the time of surgery that I would abort the procedure and refer her back to GYN/Oncology.  Risks, benefits, and alternatives have been reviewed with the patient, who wishes to proceed.  FINDINGS:  Examination under anesthesia revealed a pelvic mass in the cul-de-sac, the limits of which could not be completely identified. There was some mobility to the uterus.  At the time of laparoscopy, the patient's uterus was noted to be of retroverted.  There were some adhesions along the patient's prior C- section incision and the anterior cul-de-sac, and there were some lesions of endometriosis in this area.  The left fallopian tube demonstrated a hydrosalpinx, and the left ovary was concealed by sigmoid colon,  which was adherent to the left tube and ovary.  There was obvious endometriosis in this region, and the adnexa was contracted against the sidewall and the left uterine fundus.  A portion of the right adnexal region could be visualized, and it was clear that peritoneum was draped over the tube and ovary on this side.  The adnexa were adherent to the right uterine fundus and to the right pelvic sidewall, and the sigmoid colon and rectum were densely adherent to the adnexa on the right.  The course of the rectum was noted to pass from the patient's left hand side,  directly across the pelvic brim and then down the patient's right pelvic sidewall.  This anatomic alteration was noted.  In the right posterior cul-de-sac, there appeared to be some dense nodules of endometriosis.  The cul-de-sac itself appeared to be fairly free.  The KOH ring, which had been applied could not be visualized.  There were small omental adhesions, which were adherent to the left inferior umbilical region.  There were also some adhesions in the right mid quadrant between the cecum and the right anterior abdominal wall. All of these adhesions were lysed.  The appendix was retrocecal, and the tip could not be visualized.  In the upper abdomen, the liver was unremarkable.  There were minimal omental adhesions in the left upper quadrant along the left abdominal sidewall which were not removed.  SPECIMENS:  The pelvic washings were sent to Pathology.  DESCRIPTION OF PROCEDURE:  The patient was reidentified in the preoperative hold area.  She received TED hose and PAS stockings for DVT prophylaxis.  She received Ciprofloxacin and Flagyl IV for antibiotic prophylaxis.  In the operating room, the patient was placed in the dorsal lithotomy position with the Allen stirrups and the TrenGuard device was used to give the patient proper support.  The arms were tucked at the patient's side.  General endotracheal anesthesia was induced.  The abdomen and vagina were sterilely prepped and draped.  A Foley catheter was sterilely placed inside the bladder and the catheter was left to drainage.  Attention was turned to the umbilicus, which was everted and then grasped with 2 Adson pickups.  A 5 mm incision was created in the umbilicus.  A Veress needle was used to enter into the peritoneal cavity and the saline drop test was performed.  The fluid flowed freely.  A CO2 pneumoperitoneum was then achieved.  Towel clips were used to elevate the umbilicus at this time.  The Veress needle was  removed, and a 5 mm Optiview port was then used to visualize the peritoneal cavity.  The patient was placed in the Trendelenburg position.  Initial inspection demonstrated adhesions as noted above.  The uterus was noted to be quite retroverted at this time, and there was not adequate mobility in order to assess the cul-de-sac.  A speculum was placed inside the vagina, and a single-tooth tenaculum placed on the anterior cervical lip.  The uterus was carefully sounded at this time to 7.5 cm.  The cervix was gradually dilated under visualization of the laparoscope.  A figure-of-eight sutures of 0 Vicryl were then placed on each of the anterior and posterior cervical lips.  The RUMI device with a medium KOH ring was placed on the cervix, and there was a good application.  All remaining vaginal instruments were removed and the Foley catheter continued to be to gravity drainage.  An 11 mm right lower quadrant incision was created with a  scalpel after the skin was injected locally with 0.25% Marcaine.  An 11 mm trocar was placed at this location under visualization of the laparoscope.  Pelvic washings were obtained and sent to Pathology.  A 5 mm left mid and left lower quadrant incisions were created with a scalpel after injecting with 0.25% Marcaine.  The 5 mm trocars were placed under visualization of the laparoscope.  Further inspection of the pelvic organs was performed at this time. Lysis of adhesions of the omental adhesions to the infraumbilical region were removed with sharp dissection.  Attention was then turned to the left adnexal region, where sharp dissection was used to dissect the sigmoid colon away from the left tube and ovary.  Eventually, the left tube and ovary could be visualized.  The findings are as noted above.  Attention at this time was turned to the adhesions in the right adnexal region.  Again, sharp lysis of adhesions was performed.  With continued dissection,  it became clear that the sigmoid colon and rectum were drawn significantly to the patient's right-hand side.  These adhesions were noted at this time to be very dense.  Due to the anatomic distortion and the dense adhesions of the rectum to the right adnexa and the right pelvic sidewall inferiorly, a decision was made to abort the procedure.  The pelvis was irrigated and suctioned.  Hemostasis was noted to be excellent.  Adhesions in the right mid quadrant near the cecum were sharply excised. Hemostasis was good.  The Carter-Thomason device was used to place a 0 Vicryl suture in the right lower quadrant incision.  This closed the fascia well.  The left lower quadrant trocar was removed under visualization of the laparoscope.  The umbilical trocar was removed as well.  The left mid quadrant trocar was removed last after the remainder of the pneumoperitoneum was released.  All skin incisions were closed actually.  The operative laparoscopic ports were closed with subcuticular suture of 4-0 Vicryl.  All incisions were then closed with Dermabond.  The RUMI device was removed from the cervix.  The sutures in the anterior and posterior cervix were left in place, but the sutures were cut short.  Hemostasis was good.  The Foley catheter was removed.  The patient was cleansed of any remaining Betadine.  She was awakened and extubated and escorted to the recovery room in stable condition. There were no complications to the procedure.  All needle, instrument, and sponge counts were correct.  She will be referred to GYN Oncology for further surgical care.     Lenard Galloway, M.D.     BES/MEDQ  D:  09/07/2016  T:  09/08/2016  Job:  [id number]

## 2016-09-14 ENCOUNTER — Ambulatory Visit (INDEPENDENT_AMBULATORY_CARE_PROVIDER_SITE_OTHER): Payer: 59 | Admitting: Obstetrics and Gynecology

## 2016-09-14 ENCOUNTER — Encounter: Payer: Self-pay | Admitting: Obstetrics and Gynecology

## 2016-09-14 ENCOUNTER — Telehealth: Payer: Self-pay | Admitting: *Deleted

## 2016-09-14 VITALS — BP 108/70 | HR 80 | Resp 14 | Wt 136.0 lb

## 2016-09-14 DIAGNOSIS — Z9889 Other specified postprocedural states: Secondary | ICD-10-CM

## 2016-09-14 DIAGNOSIS — N809 Endometriosis, unspecified: Secondary | ICD-10-CM

## 2016-09-14 NOTE — Telephone Encounter (Signed)
Call to patient. Advised of appointment with Dr Denman George on 09-30-16 at 1. (initially offered 09-21-16 but per EPIC, patient has appointment at [facility] that day.) Patient agreeable to date and time. Phone number and directions given.    Routing to provider for final review. Patient agreeable to disposition. Will close encounter.

## 2016-09-14 NOTE — Progress Notes (Signed)
Opened in error

## 2016-09-14 NOTE — Progress Notes (Signed)
GYNECOLOGY  VISIT   HPI: 50 y.o.   Married  Caucasian  female   Lauren Mccullough with No LMP recorded. Patient is not currently having periods (Reason: Oral contraceptives).   here for 1 week post-op of laparoscopy diagnostic Lysis of Adhesions.  Unable to do hysterectomy due to dense bowel adhesions.   Bladder and bowel functioning well.  Not taking narcotic pain medication.   Pathology - pelvic washings - benign.   Feels much better since stopping her thyroid medication.  Being followed by [name].   GYNECOLOGIC HISTORY: No LMP recorded. Patient is not currently having periods (Reason: Oral contraceptives). Contraception:  OCP's Menopausal hormone therapy:  none Last mammogram:  09/12/15  Last pap smear:   09/12/15 WNL neg HR HPV        OB History    Gravida Para Term Preterm AB Living   2 1 1   1 1    SAB TAB Ectopic Multiple Live Births     1               Patient Active Problem List   Diagnosis Date Noted  . Elevated hemoglobin A1c 06/25/2016  . Anemia, iron deficiency 06/25/2016  . Anxiety and depression 03/26/2016  . Abdominal pain 03/26/2016  . Right ovarian cyst.  Probably endometriomas.  Do yearly ultrasound. 02/07/2016  . Elevated CA-125 02/07/2016  . MOTION SICKNESS 10/07/2010  . NEVUS 11/07/2008  . ALLERGIC RHINITIS 11/07/2008  . CERVICAL RADICULOPATHY, LEFT 12/01/2007  . HYPOTHYROIDISM 11/28/2007  . HYPERLIPIDEMIA 11/28/2007  . GERD 11/28/2007  . OTHER ABNORMAL BLOOD CHEMISTRY 11/28/2007    Past Medical History:  Diagnosis Date  . Anemia   . Anxiety   . Depression   . Elevated hemoglobin A1c   . GERD (gastroesophageal reflux disease)   . Heart murmur   . Hyperlipemia    NMR 2008, LDL 123(2539/1568) HDL 55, TG 179  . Hypothyroidism    hypothyroidism, has regulated back to normal no longer taking medication  . Infertility, female   . Iron deficiency anemia   . Pneumonia    history of   . PONV (postoperative nausea and vomiting)   . Pre-diabetes   .  Right ovarian cyst    probable endometriomas.  Do yearly ultrasound.    Past Surgical History:  Procedure Laterality Date  . CESAREAN SECTION  1993  . CHOLECYSTECTOMY  2011  . LAPAROSCOPIC LYSIS OF ADHESIONS  09/07/2016   Procedure: LAPAROSCOPIC LYSIS OF ADHESIONS;  Surgeon: Nunzio Cobbs, MD;  Location: [facility];  Service: Gynecology;;  . LAPAROSCOPY N/A 09/07/2016   Procedure: LAPAROSCOPY DIAGNOSTIC;  Surgeon: Nunzio Cobbs, MD;  Location: [facility];  Service: Gynecology;  Laterality: N/A;  collection of pelvic washings  . MOUTH SURGERY    . PELVIC LAPAROSCOPY    . WISDOM TOOTH EXTRACTION      Current Outpatient Prescriptions  Medication Sig Dispense Refill  . FERROUS FUMARATE-VITAMIN C PO Take 2 tablets by mouth daily.    . fluticasone (FLONASE) 50 MCG/ACT nasal spray Place 1 spray into both nostrils every evening.    Marland Kitchen ibuprofen (ADVIL,MOTRIN) 800 MG tablet Take 1 tablet (800 mg total) by mouth every 8 (eight) hours as needed. 30 tablet 0  . KRILL OIL PO Take 3 tablets by mouth every morning.     . lactobacillus acidophilus (BACID) TABS tablet Take 2 tablets by mouth daily.    . norethindrone (MICRONOR,CAMILA,ERRIN) 0.35 MG tablet Take 1 tablet (0.35 mg  total) by mouth daily. 1 Package 2  . RABEprazole (ACIPHEX) 20 MG tablet 1 by mouth daily 90 tablet 3  . UNABLE TO FIND 1 capsule once daily. Med Name: Dr. Anice Paganini Joint Formula    . oxyCODONE-acetaminophen (PERCOCET) 5-325 MG tablet Take 2 tablets by mouth every 4 (four) hours as needed. use only as much as needed to relieve pain (Patient not taking: Reported on 09/14/2016) 30 tablet 0   No current facility-administered medications for this visit.      ALLERGIES: Augmentin [amoxicillin-pot clavulanate]; Amoxicillin-pot clavulanate; Sulfamethoxazole-trimethoprim; Cymbalta [duloxetine hcl]; Tape; and Amoxicillin  Family History  Problem Relation Age of Onset  . Diabetes Father   . Hypertension Father   .  Diabetes Mother   . Hypertension Mother   . Thyroid disease Mother     Graves Disease  . Heart attack Maternal Grandfather   . Heart attack Paternal Grandfather   . Coronary artery disease Paternal Grandfather   . Breast cancer Paternal Grandmother   . Diabetes Maternal Grandmother   . Hypertension Maternal Grandmother     Social History   Social History  . Marital status: Married    Spouse name: N/A  . Number of children: N/A  . Years of education: N/A   Occupational History  . Not on file.   Social History Main Topics  . Smoking status: Never Smoker  . Smokeless tobacco: Never Used  . Alcohol use 3.6 oz/week    6 Standard drinks or equivalent per week  . Drug use:     Frequency: 7.0 times per week    Types: Marijuana     Comment: Ingests nightly to help with sleep  . Sexual activity: Yes    Partners: Male    Birth control/ protection: None, Pill   Other Topics Concern  . Not on file   Social History Narrative  . No narrative on file    ROS:  Pertinent items are noted in HPI.  PHYSICAL EXAMINATION:    BP 108/70 (BP Location: Right Arm, Patient Position: Sitting, Cuff Size: Normal)   Pulse 80   Resp 14   Wt 136 lb (61.7 kg)   BMI 24.87 kg/m     General appearance: alert, cooperative and appears stated age   Abdomen: incisions intact, soft, non-tender, no masses,  no organomegaly   Pelvic: External genitalia:  no lesions              Urethra:  normal appearing urethra with no masses, tenderness or lesions              Bartholins and Skenes: normal                 Vagina: normal appearing vagina with normal color and discharge, no lesions              Cervix: no lesions                Bimanual Exam:  Uterus:  normal size, contour, position, consistency, mobility, slightly tender.              Adnexa: no mass, fullness, tenderness.  Right adnexal fullness.         Chaperone was present for exam.  ASSESSMENT    Status post laparoscopic lysis of  adhesions for endometriosis.  Right ovarian cysts - endometriomas. Rectal adhesions prevented hysterectomy.   PLAN  Will refer to Dr. Denman George for further evaluation and treatment.  Resume normal activities in one week.  Follow up here prn.    An After Visit Summary was printed and given to the patient.

## 2016-09-21 ENCOUNTER — Ambulatory Visit: Payer: Self-pay | Admitting: Gynecologic Oncology

## 2016-09-24 ENCOUNTER — Encounter: Payer: Self-pay | Admitting: Obstetrics and Gynecology

## 2016-09-29 NOTE — Progress Notes (Signed)
Consult Note: Gyn-Onc  Consult was requested by Dr. Quincy Simmonds for the evaluation of Lauren Mccullough 50 y.o. female  CC:  Chief Complaint  Patient presents with  . stage IV endometriosis    Assessment/Plan:  Lauren Mccullough  is a 50 y.o.  year old with a right endometrioma and frozen pelvis on laparoscopy (09/07/16) and hormonal disregulation.  I discussed that we could reattempt robotic assisted total hysterectomy, BSO, lysis of adhesions. After viewing the images I discussed that I felt it would be technically feasible .There is increased risk of injury to the sigmoid colon in the process of separating this structure from the right ovary, and increased risk of right ureteral injury in the process of resecting a right retroperitoneal ovarian mass, however, I felt the surgery would likely go well. However, given that she denies any pain or cyclic disturbance at present, it is unclear what benefit this surgery would likely have for her at present. I expect that her right endometrioma will not resolve and may continue to slowly grow in size, however, menopause is imminent, and I would expect her endometriosis to involute at that time. Additionally, all of her symptoms relate to cycling of hormones, and I am not confident that BSO and hysterectomy will resolve those (as it would result in abrupt surgical menopause, and she experienced hypertension on estrogen before and is not a good candidate for exogenous estrogen replacement therapy).  Therefore, while I would consider surgery for her as an option, it is also reasonble to continue to control her symptoms with continuous progestin pills until she transitions into menopause. If her mass becomes symptomatic in the meantime, I would be happy to attempt surgery (robotic hysterectomy, RSO, LOA, possible BSO).  She is leaning towards expectant management.   I recommend following up with Dr Quincy Simmonds in 6 months for observation of her symptoms and  management of her her hormonal status prn.  I'd be happy to see her back to discuss hysterectomy if the patient changes her mind.  HPI: Lauren Mccullough is a 49 year old P1 who is seen in consultation at the request of Dr Quincy Simmonds for a right complex ovarian cyst and elevated CA 125.  The patient has had surveillance of a known right ovarian complex mass (dermoid vs endometrioma) since 2012. She is very concerned about surgery and hospitals and prefers to avoid surgery if possible.  As part of follow-up for her ovarian cyst, she underwent US and CA 125 in April, 2017. This showed an increase in size in the right ovarian mass now measuring 7.3 x 4 x 4.3 cm with several cystic areas with debris and thickened wall. Both    ovaries appear adherent to the uterine sidewall suggestive of adhesive disease.  CA-125 was drawn on 01/20/2016 was elevated at 87 and had previously been 28.  The patient reports that since March she's been feeling increased cyclical pain, bloating, and discomfort. 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  She saw me in consultation in May, 2017 and was counseled that I felt this represented a benign process, but offered surgery which was declined by the patient.  Interval Hx: Over subsequent repeat imaging the right ovarian cystic mass continued to minimally grow to 8.8cm in largest dimension. Her CA 125 normalized. She was treated with hormonal therapies (combined, then progestin only OCP's) which controlled bleeding and pain symptoms but did not alleviate cyclical symptoms of nausea and emotional lability. She was started on cymbalta during combined  OCP use but developed suicidal ideation and also a hypertensive episode. She was changed to continuous progestin pills and in the past 2 months on these has had no pain, and reduced cyclical GI or emotional symptoms.   She was interested in definitive treatment with hysterectomy in an attempt to resolve her cyclical emotional and GI  symptoms. On 09/07/16 she was taken to the OR with Dr Quincy Simmonds for attempted hysterectomy. A laparoscopic lysis of adhesions was performed but hysterectomy,BSO was aborted when substantial pelvic adhesions were noted between the uterus and sigmoid colon at the level of the right pelvic brim The right ovarian mass was retroperitoneal. I have viewed images from the surgery. The rectum and cul de sac were free of disease.   Current Meds:  Outpatient Encounter Prescriptions as of 09/30/2016  Medication Sig  . Ascorbic Acid (VITAMIN C) 100 MG CHEW Chew by mouth.  . FERROUS FUMARATE-VITAMIN C PO Take 2 tablets by mouth daily.  . fluticasone (FLONASE) 50 MCG/ACT nasal spray Place 1 spray into both nostrils every evening.  Marland Kitchen ibuprofen (ADVIL,MOTRIN) 800 MG tablet Take 1 tablet (800 mg total) by mouth every 8 (eight) hours as needed.  Marland Kitchen KRILL OIL PO Take 3 tablets by mouth every morning.   . lactobacillus acidophilus (BACID) TABS tablet Take 2 tablets by mouth daily.  . norethindrone (MICRONOR,CAMILA,ERRIN) 0.35 MG tablet Take 1 tablet (0.35 mg total) by mouth daily.  . RABEprazole (ACIPHEX) 20 MG tablet 1 by mouth daily  . UNABLE TO FIND 1 capsule once daily. Med Name: Dr. Anice Paganini Joint Formula  . oxyCODONE-acetaminophen (PERCOCET) 5-325 MG tablet Take 2 tablets by mouth every 4 (four) hours as needed. use only as much as needed to relieve pain (Patient not taking: Reported on 09/30/2016)   No facility-administered encounter medications on file as of 09/30/2016.     Allergy:  Allergies  Allergen Reactions  . Augmentin [Amoxicillin-Pot Clavulanate] Diarrhea  . Amoxicillin-Pot Clavulanate     REACTION: GI discomfort  . Sulfamethoxazole-Trimethoprim     REACTION: pt states makes her head itch  . Cymbalta [Duloxetine Hcl]     Heart racing, lack of concentration, suicidal ideation  . Tape     swelling  . Amoxicillin Rash    Has patient had a PCN reaction causing immediate rash, facial/tongue/throat  swelling, SOB or lightheadedness with hypotension: no Has patient had a PCN reaction causing severe rash involving mucus membranes or skin necrosis: no Has patient had a PCN reaction that required hospitalization no Has patient had a PCN reaction occurring within the last 10 years: about 10 years If all of the above answers are "NO", then may proceed with Cephalosporin use.     Social Hx:   Social History   Social History  . Marital status: Married    Spouse name: N/A  . Number of children: N/A  . Years of education: N/A   Occupational History  . Not on file.   Social History Main Topics  . Smoking status: Never Smoker  . Smokeless tobacco: Never Used  . Alcohol use 3.6 oz/week    6 Standard drinks or equivalent per week  . Drug use:     Frequency: 7.0 times per week    Types: Marijuana     Comment: Ingests nightly to help with sleep  . Sexual activity: Yes    Partners: Male    Birth control/ protection: None, Pill   Other Topics Concern  . Not on file   Social History  Narrative  . No narrative on file    Past Surgical Hx:  Past Surgical History:  Procedure Laterality Date  . CESAREAN SECTION  1993  . CHOLECYSTECTOMY  2011  . LAPAROSCOPIC LYSIS OF ADHESIONS  09/07/2016   Procedure: LAPAROSCOPIC LYSIS OF ADHESIONS;  Surgeon: Nunzio Cobbs, MD;  Location: [facility];  Service: Gynecology;;  . LAPAROSCOPY N/A 09/07/2016   Procedure: LAPAROSCOPY DIAGNOSTIC;  Surgeon: Nunzio Cobbs, MD;  Location: [facility];  Service: Gynecology;  Laterality: N/A;  collection of pelvic washings  . MOUTH SURGERY    . PELVIC LAPAROSCOPY    . WISDOM TOOTH EXTRACTION      Past Medical Hx:  Past Medical History:  Diagnosis Date  . Anemia   . Anxiety   . Depression   . Elevated hemoglobin A1c   . GERD (gastroesophageal reflux disease)   . Heart murmur   . Hyperlipemia    NMR 2008, LDL 123(2539/1568) HDL 55, TG 179  . Hypothyroidism    hypothyroidism, has regulated  back to normal no longer taking medication  . Infertility, female   . Iron deficiency anemia   . Pneumonia    history of   . PONV (postoperative nausea and vomiting)   . Pre-diabetes   . Right ovarian cyst    probable endometriomas.  Do yearly ultrasound.    Past Gynecological History:  C/s x 1 Patient has history of right ovarian cyst.  No LMP recorded. Patient is not currently having periods (Reason: Oral contraceptives).  Family Hx:  Family History  Problem Relation Age of Onset  . Diabetes Father   . Hypertension Father   . Diabetes Mother   . Hypertension Mother   . Thyroid disease Mother     Graves Disease  . Heart attack Maternal Grandfather   . Heart attack Paternal Grandfather   . Coronary artery disease Paternal Grandfather   . Breast cancer Paternal Grandmother   . Diabetes Maternal Grandmother   . Hypertension Maternal Grandmother     Review of Systems:  Constitutional  Feels well,    ENT Normal appearing ears and nares bilaterally Skin/Breast  No rash, sores, jaundice, itching, dryness Cardiovascular  No chest pain, shortness of breath, or edema  Pulmonary  No cough or wheeze.  Gastro Intestinal  No nausea, vomitting, or diarrhoea. No bright red blood per rectum, + abdominal pain, no change in bowel movement, or constipation.  Genito Urinary  No frequency, urgency, dysuria, cyclic bleeding Musculo Skeletal  No myalgia, arthralgia, joint swelling or pain  Neurologic  No weakness, numbness, change in gait,  Psychology  No depression, anxiety, insomnia.   Vitals:  Blood pressure 140/80, pulse 70, temperature 98 F (36.7 C), temperature source Oral, resp. rate 18, height 5\' 2"  (1.575 m), weight 140 lb 4.8 oz (63.6 kg), SpO2 100 %.  Physical Exam: WD in NAD Neck  Supple NROM, without any enlargements.  Lymph Node Survey No cervical supraclavicular or inguinal adenopathy Cardiovascular  Pulse normal rate, regularity and rhythm. S1 and S2 normal.   Lungs  Clear to auscultation bilateraly, without wheezes/crackles/rhonchi. Good air movement.  Skin  No rash/lesions/breakdown  Psychiatry  Alert and oriented to person, place, and time  Abdomen  Normoactive bowel sounds, abdomen soft, non-tender and nonobese without evidence of hernia.   Back No CVA tenderness Genito Urinary  deferred Rectal  deferred Extremities  No bilateral cyanosis, clubbing or edema.   Donaciano Eva, MD  09/30/2016, 5:23 PM

## 2016-09-30 ENCOUNTER — Ambulatory Visit: Payer: 59 | Attending: Gynecologic Oncology | Admitting: Gynecologic Oncology

## 2016-09-30 ENCOUNTER — Encounter: Payer: Self-pay | Admitting: Gynecologic Oncology

## 2016-09-30 VITALS — BP 140/80 | HR 70 | Temp 98.0°F | Resp 18 | Ht 62.0 in | Wt 140.3 lb

## 2016-09-30 DIAGNOSIS — N83201 Unspecified ovarian cyst, right side: Secondary | ICD-10-CM | POA: Diagnosis not present

## 2016-09-30 DIAGNOSIS — N80109 Endometriosis of ovary, unspecified side, unspecified depth: Secondary | ICD-10-CM | POA: Insufficient documentation

## 2016-09-30 DIAGNOSIS — Z8249 Family history of ischemic heart disease and other diseases of the circulatory system: Secondary | ICD-10-CM | POA: Insufficient documentation

## 2016-09-30 DIAGNOSIS — K219 Gastro-esophageal reflux disease without esophagitis: Secondary | ICD-10-CM | POA: Insufficient documentation

## 2016-09-30 DIAGNOSIS — Z8349 Family history of other endocrine, nutritional and metabolic diseases: Secondary | ICD-10-CM | POA: Diagnosis not present

## 2016-09-30 DIAGNOSIS — Z833 Family history of diabetes mellitus: Secondary | ICD-10-CM | POA: Diagnosis not present

## 2016-09-30 DIAGNOSIS — N83291 Other ovarian cyst, right side: Secondary | ICD-10-CM | POA: Insufficient documentation

## 2016-09-30 DIAGNOSIS — R971 Elevated cancer antigen 125 [CA 125]: Secondary | ICD-10-CM | POA: Insufficient documentation

## 2016-09-30 DIAGNOSIS — E039 Hypothyroidism, unspecified: Secondary | ICD-10-CM | POA: Diagnosis not present

## 2016-09-30 DIAGNOSIS — Z888 Allergy status to other drugs, medicaments and biological substances status: Secondary | ICD-10-CM | POA: Diagnosis not present

## 2016-09-30 DIAGNOSIS — E785 Hyperlipidemia, unspecified: Secondary | ICD-10-CM | POA: Diagnosis not present

## 2016-09-30 DIAGNOSIS — N801 Endometriosis of ovary: Secondary | ICD-10-CM | POA: Diagnosis not present

## 2016-09-30 DIAGNOSIS — N809 Endometriosis, unspecified: Secondary | ICD-10-CM | POA: Diagnosis present

## 2016-09-30 DIAGNOSIS — Z882 Allergy status to sulfonamides status: Secondary | ICD-10-CM | POA: Diagnosis not present

## 2016-09-30 DIAGNOSIS — Z7951 Long term (current) use of inhaled steroids: Secondary | ICD-10-CM | POA: Diagnosis not present

## 2016-09-30 NOTE — Patient Instructions (Signed)
Follow up with Dr. Quincy Simmonds in six months or sooner if needed.  Please call for any questions or concerns at [phone].

## 2016-10-05 DIAGNOSIS — G9332 Myalgic encephalomyelitis/chronic fatigue syndrome: Secondary | ICD-10-CM

## 2016-10-05 DIAGNOSIS — R5382 Chronic fatigue, unspecified: Secondary | ICD-10-CM

## 2016-10-05 HISTORY — DX: Myalgic encephalomyelitis/chronic fatigue syndrome: G93.32

## 2016-10-05 HISTORY — DX: Chronic fatigue, unspecified: R53.82

## 2016-10-19 ENCOUNTER — Ambulatory Visit: Payer: 59 | Admitting: Obstetrics and Gynecology

## 2016-10-23 NOTE — Progress Notes (Deleted)
51 y.o. G36P1011 Married Caucasian female here for annual exam.    PCP:     No LMP recorded. Patient is not currently having periods (Reason: Oral contraceptives).           Sexually active: {yes no:314532}  The current method of family planning is {contraception:315051}.    Exercising: {yes no:314532}  {types:19826} Smoker:  no  Health Maintenance: Pap:  09-12-15 Neg:Neg HR HPV History of abnormal Pap:  no MMG: 08-08-16 3D Density C/Benign calcifications in Left breast/BiRads2:[facility] Colonoscopy:  *** BMD:   n/a  Result  n/a TDaP:  *** Gardasil:   N/A HIV: Hep C: Screening Labs:  Hb today: ***, Urine today: ***   reports that she has never smoked. She has never used smokeless tobacco. She reports that she drinks about 3.6 oz of alcohol per week . She reports that she uses drugs, including Marijuana, about 7 times per week.  Past Medical History:  Diagnosis Date  . Anemia   . Anxiety   . Depression   . Elevated hemoglobin A1c   . GERD (gastroesophageal reflux disease)   . Heart murmur   . Hyperlipemia    NMR 2008, LDL 123(2539/1568) HDL 55, TG 179  . Hypothyroidism    hypothyroidism, has regulated back to normal no longer taking medication  . Infertility, female   . Iron deficiency anemia   . Pneumonia    history of   . PONV (postoperative nausea and vomiting)   . Pre-diabetes   . Right ovarian cyst    probable endometriomas.  Do yearly ultrasound.    Past Surgical History:  Procedure Laterality Date  . CESAREAN SECTION  1993  . CHOLECYSTECTOMY  2011  . LAPAROSCOPIC LYSIS OF ADHESIONS  09/07/2016   Procedure: LAPAROSCOPIC LYSIS OF ADHESIONS;  Surgeon: Nunzio Cobbs, MD;  Location: [facility];  Service: Gynecology;;  . LAPAROSCOPY N/A 09/07/2016   Procedure: LAPAROSCOPY DIAGNOSTIC;  Surgeon: Nunzio Cobbs, MD;  Location: [facility];  Service: Gynecology;  Laterality: N/A;  collection of pelvic washings  . MOUTH SURGERY    . PELVIC LAPAROSCOPY    .  WISDOM TOOTH EXTRACTION      Current Outpatient Prescriptions  Medication Sig Dispense Refill  . Ascorbic Acid (VITAMIN C) 100 MG CHEW Chew by mouth.    . FERROUS FUMARATE-VITAMIN C PO Take 2 tablets by mouth daily.    . fluticasone (FLONASE) 50 MCG/ACT nasal spray Place 1 spray into both nostrils every evening.    Marland Kitchen ibuprofen (ADVIL,MOTRIN) 800 MG tablet Take 1 tablet (800 mg total) by mouth every 8 (eight) hours as needed. 30 tablet 0  . KRILL OIL PO Take 3 tablets by mouth every morning.     . lactobacillus acidophilus (BACID) TABS tablet Take 2 tablets by mouth daily.    . norethindrone (MICRONOR,CAMILA,ERRIN) 0.35 MG tablet Take 1 tablet (0.35 mg total) by mouth daily. 1 Package 2  . oxyCODONE-acetaminophen (PERCOCET) 5-325 MG tablet Take 2 tablets by mouth every 4 (four) hours as needed. use only as much as needed to relieve pain (Patient not taking: Reported on 09/30/2016) 30 tablet 0  . RABEprazole (ACIPHEX) 20 MG tablet 1 by mouth daily 90 tablet 3  . UNABLE TO FIND 1 capsule once daily. Med Name: Dr. Anice Paganini Joint Formula     No current facility-administered medications for this visit.     Family History  Problem Relation Age of Onset  . Diabetes Father   .  Hypertension Father   . Diabetes Mother   . Hypertension Mother   . Thyroid disease Mother     Graves Disease  . Heart attack Maternal Grandfather   . Heart attack Paternal Grandfather   . Coronary artery disease Paternal Grandfather   . Breast cancer Paternal Grandmother   . Diabetes Maternal Grandmother   . Hypertension Maternal Grandmother     ROS:  Pertinent items are noted in HPI.  Otherwise, a comprehensive ROS was negative.  Exam:   There were no vitals taken for this visit.    General appearance: alert, cooperative and appears stated age Head: Normocephalic, without obvious abnormality, atraumatic Neck: no adenopathy, supple, symmetrical, trachea midline and thyroid normal to inspection and  palpation Lungs: clear to auscultation bilaterally Breasts: normal appearance, no masses or tenderness, No nipple retraction or dimpling, No nipple discharge or bleeding, No axillary or supraclavicular adenopathy Heart: regular rate and rhythm Abdomen: soft, non-tender; no masses, no organomegaly Extremities: extremities normal, atraumatic, no cyanosis or edema Skin: Skin color, texture, turgor normal. No rashes or lesions Lymph nodes: Cervical, supraclavicular, and axillary nodes normal. No abnormal inguinal nodes palpated Neurologic: Grossly normal  Pelvic: External genitalia:  no lesions              Urethra:  normal appearing urethra with no masses, tenderness or lesions              Bartholins and Skenes: normal                 Vagina: normal appearing vagina with normal color and discharge, no lesions              Cervix: no lesions              Pap taken: {yes no:314532} Bimanual Exam:  Uterus:  normal size, contour, position, consistency, mobility, non-tender              Adnexa: no mass, fullness, tenderness              Rectal exam: {yes no:314532}.  Confirms.              Anus:  normal sphincter tone, no lesions  Chaperone was present for exam.  Assessment:   Well woman visit with normal exam.   Plan: Mammogram screening discussed. Recommended self breast awareness. Pap and HR HPV as above. Guidelines for Calcium, Vitamin D, regular exercise program including cardiovascular and weight bearing exercise.   Follow up annually and prn.   Additional counseling given.  {yes [id number]. _______ minutes face to face time of which over 50% was spent in counseling.    After visit summary provided.

## 2016-10-26 ENCOUNTER — Other Ambulatory Visit: Payer: Self-pay | Admitting: Obstetrics and Gynecology

## 2016-10-26 ENCOUNTER — Ambulatory Visit: Payer: 59 | Admitting: Obstetrics and Gynecology

## 2016-10-26 NOTE — Telephone Encounter (Signed)
Medication refill request: [name] AEX:  09/12/15  Next AEX: 11/16/16  Last MMG (if hormonal medication request): 08/18/16 BIRADS2:Benign  Refill authorized: 08/13/16 #1pack/2R. Today #3pack/0R?

## 2016-11-16 ENCOUNTER — Ambulatory Visit: Payer: 59 | Admitting: Obstetrics and Gynecology

## 2016-12-14 ENCOUNTER — Other Ambulatory Visit: Payer: Self-pay | Admitting: Obstetrics and Gynecology

## 2016-12-14 NOTE — Telephone Encounter (Signed)
Medication refill request: Norethindrone Last AEX:  09/12/15 BS Next AEX: Not scheduled (cancelled last 3 appts) Last MMG (if hormonal medication request): 08/18/16 BIRADS2, Density C, [facility] Refill authorized: 10/26/16 #[id number] 0R. Please advise. Thank you.

## 2016-12-14 NOTE — Telephone Encounter (Signed)
Needs annual exam for further refills beyond this Rx.  Patient was seen for several visits last year about her ovarian cyst, but now she needs to have her annual exam completed.  Cc- Lauren Mccullough

## 2016-12-15 NOTE — Telephone Encounter (Signed)
Per DPR, left detailed message for patient to call the office and schedule her AEX.

## 2017-02-15 ENCOUNTER — Encounter: Payer: Self-pay | Admitting: Obstetrics and Gynecology

## 2017-02-15 ENCOUNTER — Ambulatory Visit (INDEPENDENT_AMBULATORY_CARE_PROVIDER_SITE_OTHER): Payer: 59 | Admitting: Obstetrics and Gynecology

## 2017-02-15 VITALS — BP 140/72 | HR 70 | Resp 14 | Ht 62.0 in | Wt 145.6 lb

## 2017-02-15 DIAGNOSIS — Z01419 Encounter for gynecological examination (general) (routine) without abnormal findings: Secondary | ICD-10-CM | POA: Diagnosis not present

## 2017-02-15 MED ORDER — NORETHINDRONE 0.35 MG PO TABS: 1.0000 | ORAL_TABLET | Freq: Every day | ORAL | 3 refills | Status: AC

## 2017-02-15 NOTE — Progress Notes (Signed)
51 y.o. G22P1011 Married Caucasian female here for annual exam.    Feels like she had a flare this weekend of chronic fatigue.   On Micronor. Has menses monthly but now is late this month.  This is not unusual now for her menses to be spacing out.  Remembers to take the pill daily.  Declines UPT today.   Pelvis is feeling much better since surgery.  "Feels like I am back to me."  Asking about her endometriosis and life history.   Reporting decreased sex drive.  Denies pain with intercourse.   She brought in a copy of her recent labs.  PCP:   Threasa Beards, MD  Patient's last menstrual period was 01/05/2017 (exact date).     Period Cycle (Days): 30 Period Duration (Days): 3-4 days Period Pattern: Regular Menstrual Flow: Moderate Menstrual Control: Tampon, Maxi pad Menstrual Control Change Freq (Hours): every 3-4 hours on heaviest day Dysmenorrhea: (!) Moderate (mild to moderate) Dysmenorrhea Symptoms: Cramping     Sexually active: Yes.   female The current method of family planning is none.    Exercising: Yes.    Yoga and walking Smoker:  no  Health Maintenance: Pap: 09-13-15 Neg;Neg HR HPV;08-23-14 Neg History of abnormal Pap:  no MMG:  08-18-16 3D Density C/benign calcifications left breast/Neg/BiRads2:[facility] Colonoscopy:  NEVER.  PCP sent ColoGuard to her home. BMD:   n/a  Result  n/a TDaP:  Greater than 10 years--PT. DECLINES Gardasil:   no HIV:Neg Hep C: Neg Screening Labs:  Hb today: PCP, Urine today: not done   reports that she has never smoked. She has never used smokeless tobacco. She reports that she drinks about 7.8 oz of alcohol per week . She reports that she uses drugs, including Marijuana, about 7 times per week.  Past Medical History:  Diagnosis Date  . Anemia   . Anxiety   . Chronic fatigue syndrome 2018  . Depression   . Elevated hemoglobin A1c   . GERD (gastroesophageal reflux disease)   . Heart murmur   . Hyperlipemia    NMR 2008, LDL  123(2539/1568) HDL 55, TG 179  . Hypothyroidism    hypothyroidism, has regulated back to normal no longer taking medication  . Infertility, female   . Iron deficiency anemia   . Pneumonia    history of   . PONV (postoperative nausea and vomiting)   . Pre-diabetes   . Right ovarian cyst    probable endometriomas.  Do yearly ultrasound.    Past Surgical History:  Procedure Laterality Date  . CESAREAN SECTION  1993  . CHOLECYSTECTOMY  2011  . LAPAROSCOPIC LYSIS OF ADHESIONS  09/07/2016   Procedure: LAPAROSCOPIC LYSIS OF ADHESIONS;  Surgeon: Nunzio Cobbs, MD;  Location: [facility];  Service: Gynecology;;  . LAPAROSCOPY N/A 09/07/2016   Procedure: LAPAROSCOPY DIAGNOSTIC;  Surgeon: Nunzio Cobbs, MD;  Location: [facility];  Service: Gynecology;  Laterality: N/A;  collection of pelvic washings  . MOUTH SURGERY    . PELVIC LAPAROSCOPY    . WISDOM TOOTH EXTRACTION      Current Outpatient Prescriptions  Medication Sig Dispense Refill  . 5-Hydroxytryptophan (5-HTP) 50 MG CAPS Take 1 tablet by mouth at bedtime.    . Ascorbic Acid (VITAMIN C) 100 MG CHEW Chew by mouth.    . ASTAXANTHIN PO Take 1 tablet by mouth daily.    . fluticasone (FLONASE) 50 MCG/ACT nasal spray Place 1 spray into both nostrils every evening.    Marland Kitchen  lactobacillus acidophilus (BACID) TABS tablet Take 2 tablets by mouth daily.    . norethindrone (MICRONOR,CAMILA,ERRIN) 0.35 MG tablet TAKE 1 TABLET BY MOUTH  DAILY 84 tablet 0  . OVER THE COUNTER MEDICATION Hepa Lance Bosch -- Takes 1 tablet bid    . OVER THE COUNTER MEDICATION Sinilase Digestive Enzyme: Takes 1 tablet before each meal    . PRESCRIPTION MEDICATION Pregnenalone SR 1ng -- Takes Mon, Wed, Fri    . RABEprazole (ACIPHEX) 20 MG tablet 1 by mouth daily 90 tablet 3  . UNABLE TO FIND 1 capsule once daily. Med Name: Dr. Anice Paganini Joint Formula    . ZINC GLUCONATE PO Take 1 tablet by mouth daily.     No current facility-administered medications for this visit.      Family History  Problem Relation Age of Onset  . Diabetes Father   . Hypertension Father   . Diabetes Mother   . Hypertension Mother   . Thyroid disease Mother        Graves Disease  . Heart attack Maternal Grandfather   . Heart attack Paternal Grandfather   . Coronary artery disease Paternal Grandfather   . Breast cancer Paternal Grandmother   . Diabetes Maternal Grandmother   . Hypertension Maternal Grandmother     ROS:  Pertinent items are noted in HPI.  Otherwise, a comprehensive ROS was negative.  Exam:   BP 140/72 (BP Location: Right Arm, Patient Position: Sitting, Cuff Size: Normal)   Pulse 70   Resp 14   Ht 5\' 2"  (1.575 m)   Wt 145 lb 9.6 oz (66 kg)   LMP 01/05/2017 (Exact Date)   BMI 26.63 kg/m     General appearance: alert, cooperative and appears stated age Head: Normocephalic, without obvious abnormality, atraumatic Neck: no adenopathy, supple, symmetrical, trachea midline and thyroid normal to inspection and palpation Lungs: clear to auscultation bilaterally Breasts: normal appearance, no masses or tenderness, No nipple retraction or dimpling, No nipple discharge or bleeding, No axillary or supraclavicular adenopathy Heart: regular rate and rhythm Abdomen: soft, non-tender; no masses, no organomegaly Extremities: extremities normal, atraumatic, no cyanosis or edema Skin: Skin color, texture, turgor normal. No rashes or lesions Lymph nodes: Cervical, supraclavicular, and axillary nodes normal. No abnormal inguinal nodes palpated Neurologic: Grossly normal  Pelvic: External genitalia:  no lesions              Urethra:  normal appearing urethra with no masses, tenderness or lesions              Bartholins and Skenes: normal                 Vagina: normal appearing vagina with normal color and discharge, no lesions              Cervix: no lesions              Pap taken: No. Bimanual Exam:  Uterus:  normal size, contour, position, consistency, mobility,  non-tender              Adnexa:  Right adnexal mass 7 - 8 cm to the right of cervix and slightly behind it.               Rectal exam: No..   Declined.   Chaperone was present for exam.  Assessment:   Well woman visit with normal exam. Status post laparoscopy with lysis of adhesions and collection of pelvic washings.  Has right ovarian endometriosis cysts still. Due to  extensive adhesions and concern for bowel prep, the right ovary was not removed at her surgery.  Decreased libido.  Plan: Mammogram screening discussed. Recommended self breast awareness. Pap and HR HPV as above. Guidelines for Calcium, Vitamin D, regular exercise program including cardiovascular and weight bearing exercise. Refill Micronor for one year.  Follow patient symptomatically for recurrence or worsening of endometriosis.  Discussed expectations for improvement of endometriosis symptoms with menopause.   Adhesive disease, however, will persist. I referred her to [facility].   We talked about testosterone therapy.  Patient will start with counseling first.  Copy of her recent labs will be scanned in.  Follow up annually and prn.   After visit summary provided.

## 2017-02-15 NOTE — Patient Instructions (Signed)

## 2017-02-24 ENCOUNTER — Other Ambulatory Visit: Payer: Self-pay | Admitting: Family Medicine

## 2017-08-03 ENCOUNTER — Telehealth: Payer: Self-pay | Admitting: Obstetrics and Gynecology

## 2017-08-03 NOTE — Telephone Encounter (Signed)
[facility] calling for authorization for norethindrone. Reference number [id number] Ph: [phone]

## 2017-08-03 NOTE — Telephone Encounter (Signed)
Spoke with patient, patient states to disregard this message. [facility] sent refill.

## 2020-01-22 ENCOUNTER — Other Ambulatory Visit: Payer: Self-pay | Admitting: Obstetrics and Gynecology

## 2020-01-22 DIAGNOSIS — N83291 Other ovarian cyst, right side: Secondary | ICD-10-CM

## 2020-08-20 ENCOUNTER — Other Ambulatory Visit: Payer: Self-pay | Admitting: Family Medicine

## 2020-08-20 DIAGNOSIS — R945 Abnormal results of liver function studies: Secondary | ICD-10-CM

## 2020-09-04 ENCOUNTER — Other Ambulatory Visit: Payer: 59

## 2020-09-13 ENCOUNTER — Other Ambulatory Visit: Payer: BC Managed Care – PPO

## 2020-12-10 ENCOUNTER — Ambulatory Visit
Admission: RE | Admit: 2020-12-10 | Discharge: 2020-12-10 | Disposition: A | Discharge status: Home / Self Care | Payer: BC Managed Care – PPO | Source: Ambulatory Visit | Attending: Family Medicine | Admitting: Family Medicine

## 2020-12-10 DIAGNOSIS — R945 Abnormal results of liver function studies: Secondary | ICD-10-CM

## 2020-12-19 ENCOUNTER — Other Ambulatory Visit: Payer: Self-pay | Admitting: Gastroenterology

## 2020-12-19 DIAGNOSIS — R935 Abnormal findings on diagnostic imaging of other abdominal regions, including retroperitoneum: Secondary | ICD-10-CM

## 2021-01-02 ENCOUNTER — Other Ambulatory Visit: Payer: BC Managed Care – PPO

## 2021-01-03 ENCOUNTER — Ambulatory Visit
Admission: RE | Admit: 2021-01-03 | Discharge: 2021-01-03 | Disposition: A | Discharge status: Home / Self Care | Payer: BC Managed Care – PPO | Source: Ambulatory Visit | Attending: Gastroenterology | Admitting: Gastroenterology

## 2021-01-03 DIAGNOSIS — R935 Abnormal findings on diagnostic imaging of other abdominal regions, including retroperitoneum: Secondary | ICD-10-CM

## 2022-07-30 IMAGING — US US ABDOMEN LIMITED RUQ/ASCITES
1 series · 13 of 25 positions shown · non-contrast
Comparison: Abdominal ultrasound September 06, 2009

CLINICAL DATA: Elevated liver enzymes

EXAM:
ULTRASOUND ABDOMEN LIMITED RIGHT UPPER QUADRANT

[Series 1: us abdomen limited ruq/ascites · 0.26mm/px · 13 of 69 slices shown]
[im 1/69]
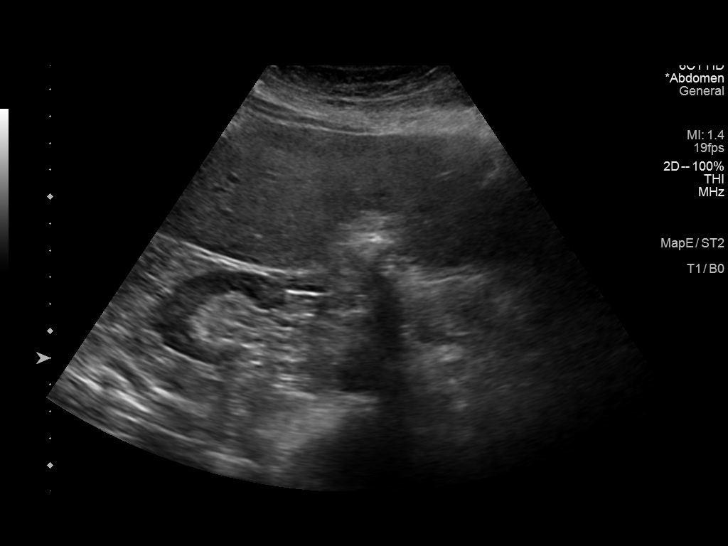
[im 6/69]
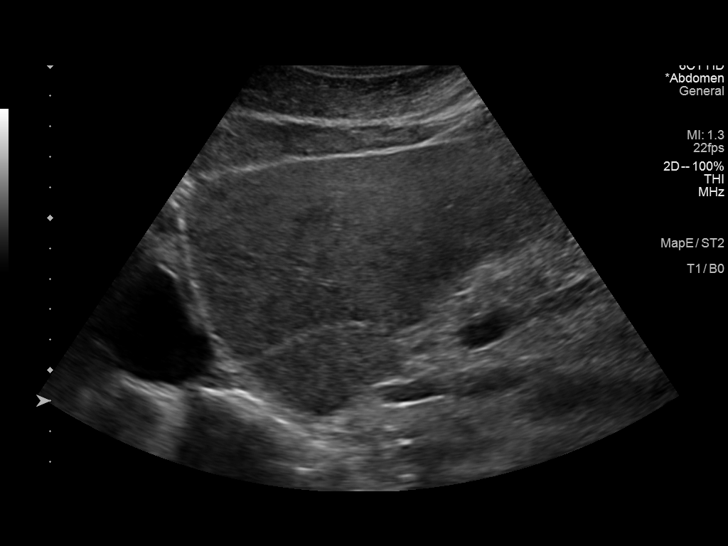
[im 12/69]
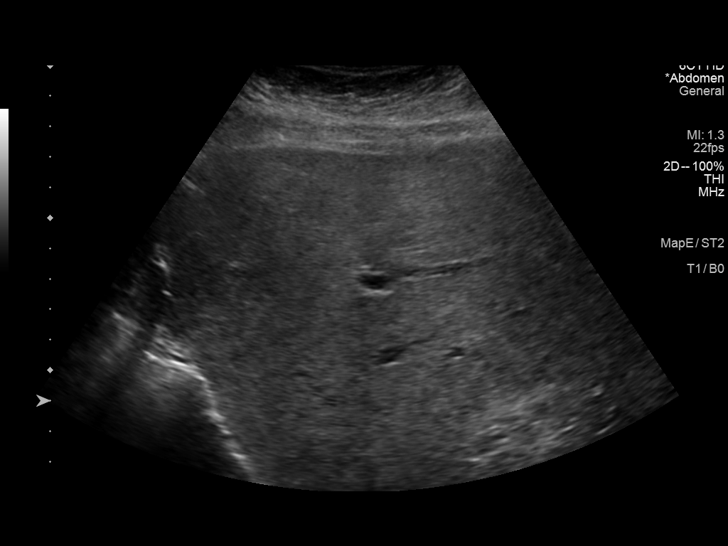
[im 18/69]
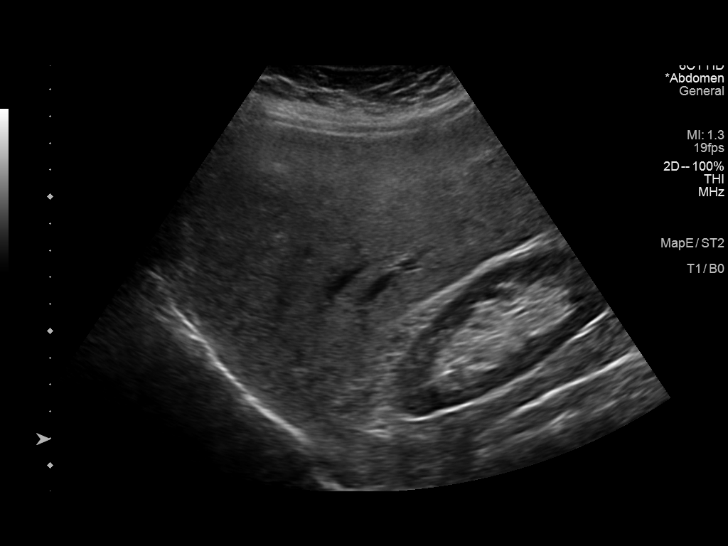
[im 23/69]
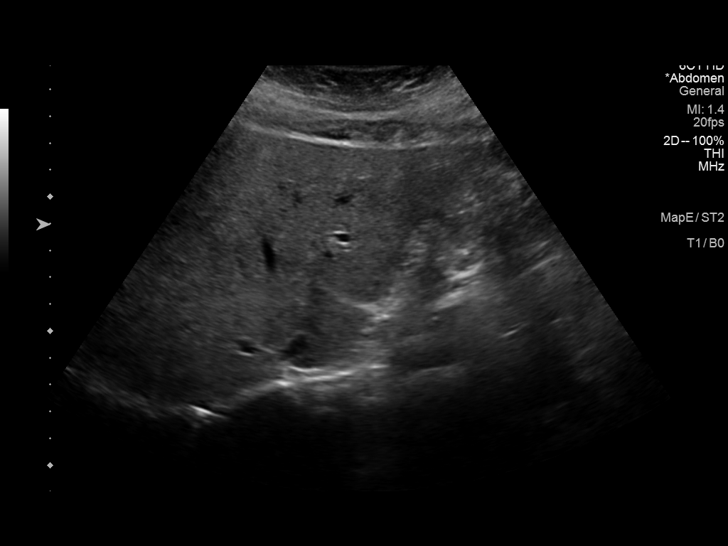
[im 29/69]
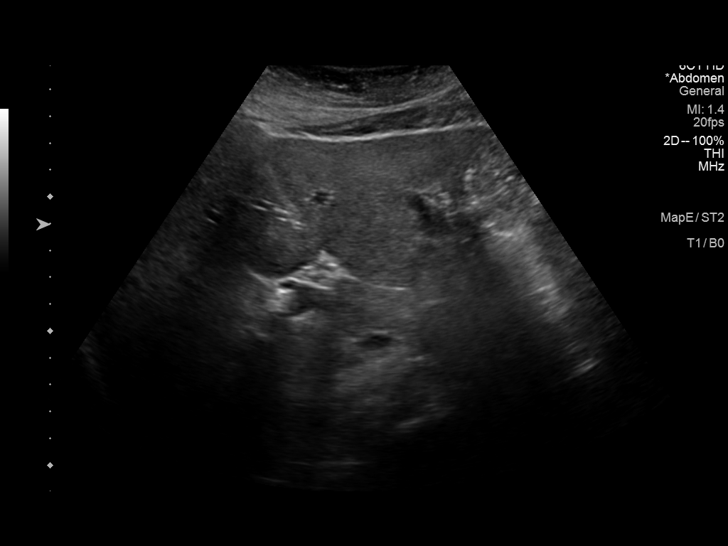
[im 35/69]
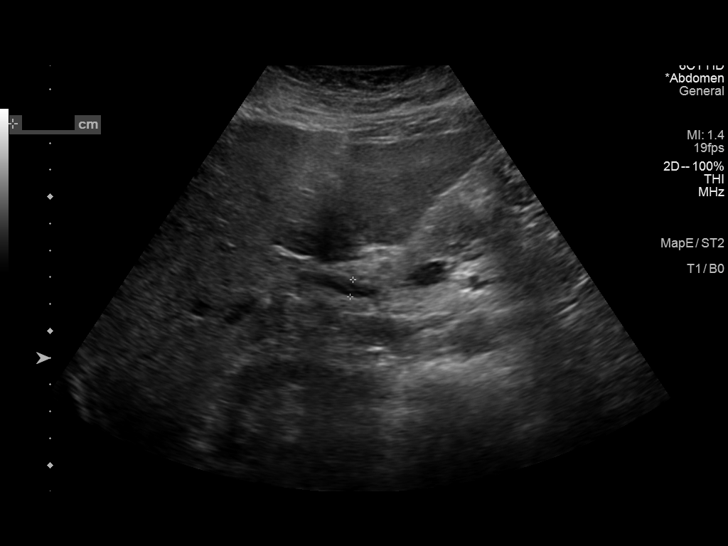
[im 40/69]
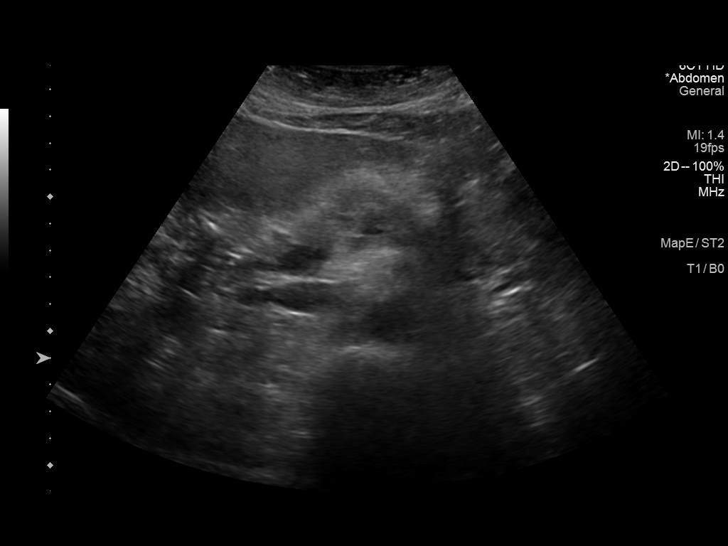
[im 46/69]
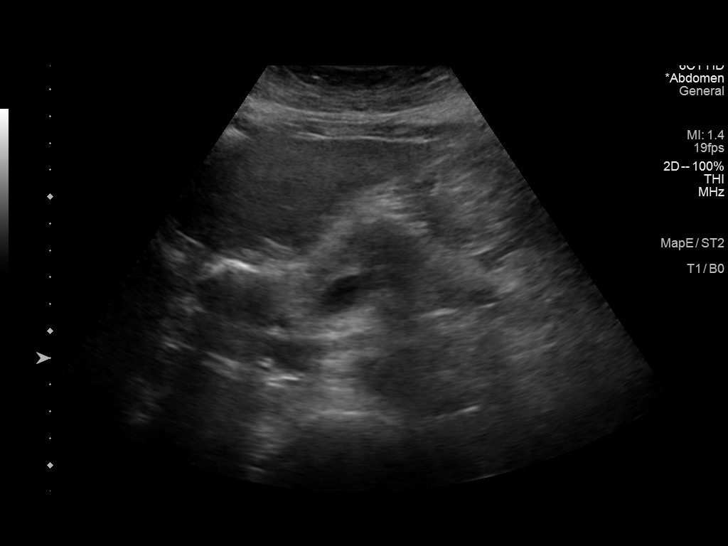
[im 52/69]
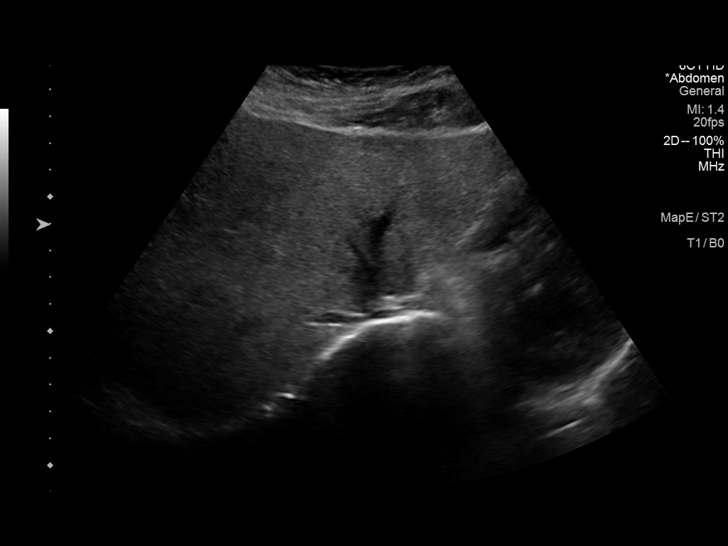
[im 57/69]
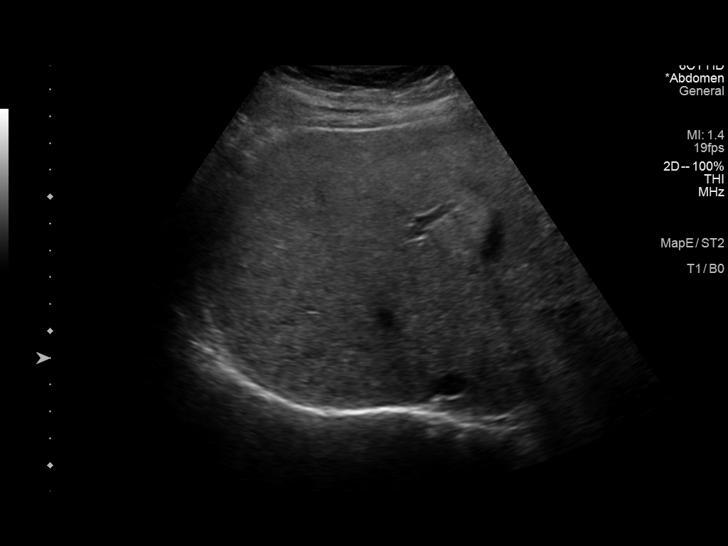
[im 63/69]
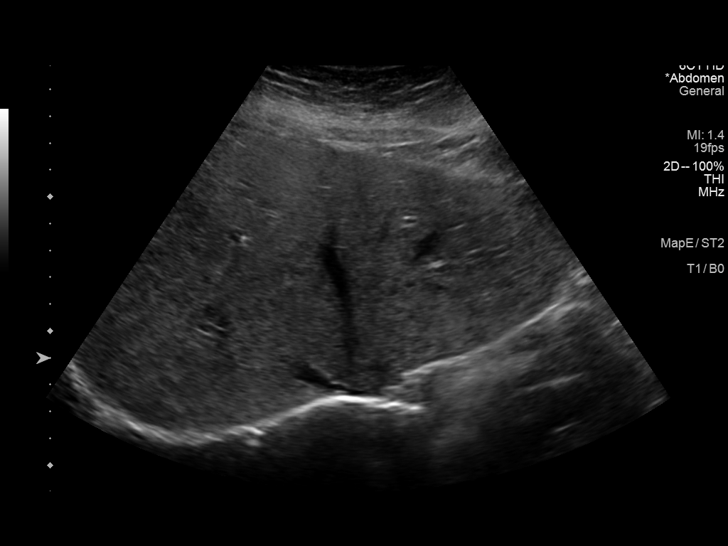
[im 69/69]
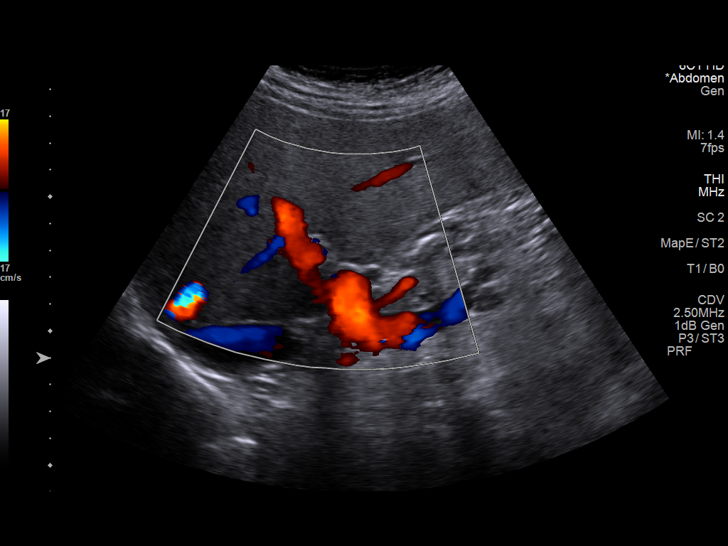

[13 of 25 positions shown; findings below may reference images not displayed]

FINDINGS: Gallbladder:

Surgically absent.

Common bile duct:

Diameter: 4 mm proximally, dilated 6 mm distally. No intrahepatic
biliary duct dilatation. No focal biliary duct lesion evident.

Liver:

No focal lesion identified. Within normal limits in parenchymal
echogenicity. Portal vein is patent on color Doppler imaging with
normal direction of blood flow towards the liver.

Other: There is a rather subtle area of decreased echogenicity in
the region of the body of the pancreas, less than optimally
delineated. This area of decreased echogenicity measures
approximately 2 x 1.5 cm.
IMPRESSION: 1. Subtle hypoechoic area in the mid pancreas, concerning for mass
in this area. This finding is felt to warrant further evaluation
with CT or MR of the pancreas pre and post-contrast to further
evaluate.

2.  Gallbladder absent.

3.  Study otherwise unremarkable.

These results will be called to the ordering clinician or
representative by the Radiologist Assistant, and communication
documented in the PACS or [REDACTED].

## 2022-09-30 ENCOUNTER — Ambulatory Visit
Admission: RE | Admit: 2022-09-30 | Discharge: 2022-09-30 | Disposition: A | Discharge status: Home / Self Care | Payer: 59 | Source: Ambulatory Visit | Attending: Family Medicine | Admitting: Family Medicine

## 2022-09-30 ENCOUNTER — Other Ambulatory Visit: Payer: Self-pay | Admitting: Family Medicine

## 2022-09-30 DIAGNOSIS — M25551 Pain in right hip: Secondary | ICD-10-CM
